# Patient Record
Sex: Female | Born: 1980
Health system: Southern US, Community
[De-identification: ages and names within clinical notes are randomized; demographics above are authoritative.]

## PROBLEM LIST (undated history)

## (undated) DIAGNOSIS — K589 Irritable bowel syndrome without diarrhea: Secondary | ICD-10-CM

## (undated) DIAGNOSIS — T7840XA Allergy, unspecified, initial encounter: Secondary | ICD-10-CM

## (undated) DIAGNOSIS — R008 Other abnormalities of heart beat: Secondary | ICD-10-CM

## (undated) DIAGNOSIS — K219 Gastro-esophageal reflux disease without esophagitis: Secondary | ICD-10-CM

## (undated) DIAGNOSIS — L309 Dermatitis, unspecified: Secondary | ICD-10-CM

## (undated) DIAGNOSIS — E079 Disorder of thyroid, unspecified: Secondary | ICD-10-CM

## (undated) DIAGNOSIS — J302 Other seasonal allergic rhinitis: Secondary | ICD-10-CM

## (undated) DIAGNOSIS — E039 Hypothyroidism, unspecified: Secondary | ICD-10-CM

## (undated) DIAGNOSIS — F419 Anxiety disorder, unspecified: Secondary | ICD-10-CM

## (undated) DIAGNOSIS — I493 Ventricular premature depolarization: Secondary | ICD-10-CM

## (undated) HISTORY — PX: COLONOSCOPY: SHX174

## (undated) HISTORY — DX: Gastro-esophageal reflux disease without esophagitis: K21.9

## (undated) HISTORY — DX: Allergy, unspecified, initial encounter: T78.40XA

## (undated) HISTORY — PX: DIAGNOSTIC LAPAROSCOPY: SUR761

## (undated) HISTORY — DX: Disorder of thyroid, unspecified: E07.9

## (undated) HISTORY — DX: Dermatitis, unspecified: L30.9

## (undated) HISTORY — PX: UPPER GI ENDOSCOPY: SHX6162

## (undated) HISTORY — PX: WISDOM TOOTH EXTRACTION: SHX21

## (undated) HISTORY — DX: Other abnormalities of heart beat: R00.8

---

## 2011-08-16 HISTORY — PX: CHOLECYSTECTOMY: SHX55

## 2014-02-27 DIAGNOSIS — R002 Palpitations: Secondary | ICD-10-CM | POA: Insufficient documentation

## 2014-08-15 HISTORY — PX: OTHER SURGICAL HISTORY: SHX169

## 2015-08-16 HISTORY — PX: OOPHORECTOMY: SHX86

## 2015-08-16 HISTORY — PX: TUBAL LIGATION: SHX77

## 2017-09-11 LAB — HM MAMMOGRAPHY

## 2018-01-13 DIAGNOSIS — F411 Generalized anxiety disorder: Secondary | ICD-10-CM | POA: Diagnosis not present

## 2018-01-23 DIAGNOSIS — N944 Primary dysmenorrhea: Secondary | ICD-10-CM | POA: Diagnosis not present

## 2018-01-23 DIAGNOSIS — N920 Excessive and frequent menstruation with regular cycle: Secondary | ICD-10-CM | POA: Diagnosis not present

## 2018-01-25 DIAGNOSIS — F411 Generalized anxiety disorder: Secondary | ICD-10-CM | POA: Diagnosis not present

## 2018-01-31 MED FILL — NORETHINDRONE 5 MG TABLET: 5 | 30 days supply | Qty: 30 | Fill #0

## 2018-02-14 ENCOUNTER — Encounter: Payer: Self-pay | Admitting: Nurse Practitioner

## 2018-02-14 ENCOUNTER — Ambulatory Visit: Payer: Self-pay | Admitting: Nurse Practitioner

## 2018-02-14 VITALS — BP 130/86 | HR 96 | Temp 98.5°F | Ht 67.0 in | Wt 210.0 lb

## 2018-02-14 DIAGNOSIS — E559 Vitamin D deficiency, unspecified: Secondary | ICD-10-CM | POA: Insufficient documentation

## 2018-02-14 DIAGNOSIS — Z136 Encounter for screening for cardiovascular disorders: Secondary | ICD-10-CM

## 2018-02-14 DIAGNOSIS — N921 Excessive and frequent menstruation with irregular cycle: Secondary | ICD-10-CM | POA: Diagnosis not present

## 2018-02-14 DIAGNOSIS — J309 Allergic rhinitis, unspecified: Secondary | ICD-10-CM

## 2018-02-14 DIAGNOSIS — Z13220 Encounter for screening for lipoid disorders: Secondary | ICD-10-CM

## 2018-02-14 DIAGNOSIS — E039 Hypothyroidism, unspecified: Secondary | ICD-10-CM | POA: Insufficient documentation

## 2018-02-14 DIAGNOSIS — F411 Generalized anxiety disorder: Secondary | ICD-10-CM | POA: Diagnosis not present

## 2018-02-14 DIAGNOSIS — J3089 Other allergic rhinitis: Secondary | ICD-10-CM | POA: Insufficient documentation

## 2018-02-14 DIAGNOSIS — Z0001 Encounter for general adult medical examination with abnormal findings: Secondary | ICD-10-CM | POA: Diagnosis not present

## 2018-02-14 DIAGNOSIS — K219 Gastro-esophageal reflux disease without esophagitis: Secondary | ICD-10-CM | POA: Diagnosis not present

## 2018-02-14 DIAGNOSIS — Z716 Tobacco abuse counseling: Secondary | ICD-10-CM | POA: Diagnosis not present

## 2018-02-14 DIAGNOSIS — M25472 Effusion, left ankle: Principal | ICD-10-CM

## 2018-02-14 DIAGNOSIS — M25471 Effusion, right ankle: Secondary | ICD-10-CM

## 2018-02-14 DIAGNOSIS — Z720 Tobacco use: Secondary | ICD-10-CM | POA: Diagnosis not present

## 2018-02-14 LAB — COMPREHENSIVE METABOLIC PANEL
ALT: 18 U/L (ref 0–35)
AST: 13 U/L (ref 0–37)
Albumin: 4.2 g/dL (ref 3.5–5.2)
Alkaline Phosphatase: 82 U/L (ref 39–117)
BILIRUBIN TOTAL: 0.3 mg/dL (ref 0.2–1.2)
BUN: 10 mg/dL (ref 6–23)
CO2: 27 meq/L (ref 19–32)
Calcium: 9.3 mg/dL (ref 8.4–10.5)
Chloride: 102 mEq/L (ref 96–112)
Creatinine, Ser: 0.73 mg/dL (ref 0.40–1.20)
GFR: 95.45 mL/min (ref >60.00)
GLUCOSE: 93 mg/dL (ref 70–99)
Potassium: 3.7 mEq/L (ref 3.5–5.1)
Sodium: 137 mEq/L (ref 135–145)
TOTAL PROTEIN: 6.9 g/dL (ref 6.0–8.3)

## 2018-02-14 LAB — LIPID PANEL
CHOL/HDL RATIO: 4
Cholesterol: 179 mg/dL (ref 0–200)
HDL: 40.1 mg/dL (ref >39.00)
LDL Cholesterol: 103 mg/dL — ABNORMAL HIGH (ref 0–99)
NONHDL: 138.98
Triglycerides: 181 mg/dL — ABNORMAL HIGH (ref 0.0–149.0)
VLDL: 36.2 mg/dL (ref 0.0–40.0)

## 2018-02-14 LAB — CBC
HCT: 39.3 % (ref 36.0–46.0)
Hemoglobin: 13.4 g/dL (ref 12.0–15.0)
MCHC: 34.1 g/dL (ref 30.0–36.0)
MCV: 88 fl (ref 78.0–100.0)
PLATELETS: 393 10*3/uL (ref 150.0–400.0)
RBC: 4.47 Mil/uL (ref 3.87–5.11)
RDW: 14.2 % (ref 11.5–15.5)
WBC: 7.7 10*3/uL (ref 4.0–10.5)

## 2018-02-14 LAB — IBC PANEL
IRON: 87 ug/dL (ref 42–145)
SATURATION RATIOS: 21.6 % (ref 20.0–50.0)
Transferrin: 288 mg/dL (ref 212.0–360.0)

## 2018-02-14 LAB — T4, FREE: FREE T4: 0.93 ng/dL (ref 0.60–1.60)

## 2018-02-14 LAB — TSH: TSH: 4.29 u[IU]/mL (ref 0.35–4.50)

## 2018-02-14 MED ORDER — FEXOFENADINE HCL 180 MG PO TABS
180.0000 mg | ORAL_TABLET | Freq: Every day | ORAL | 3 refills | Status: DC
Start: 2018-02-14 — End: 2018-10-12

## 2018-02-14 MED ORDER — MONTELUKAST SODIUM 10 MG PO TABS: 10.0000 mg | ORAL_TABLET | Freq: Every day | ORAL | 1 refills | Status: DC

## 2018-02-14 MED ORDER — RANITIDINE HCL 150 MG PO TABS
150.0000 mg | ORAL_TABLET | Freq: Every day | ORAL | 3 refills | Status: DC
Start: 2018-02-14 — End: 2018-10-12

## 2018-02-14 MED ORDER — LEVOTHYROXINE SODIUM 75 MCG PO TABS: 75.0000 ug | ORAL_TABLET | Freq: Every day | ORAL | 1 refills | Status: DC

## 2018-02-14 MED FILL — raNITIdine HCL 150 MG TABS: 150 | 90 days supply | Qty: 90 | Fill #0

## 2018-02-14 NOTE — Progress Notes (Signed)
Subjective:    Patient ID: Katie Bishop, female    DOB: 05-Nov-1980, 37 y.o.   MRN: [id number]  Patient presents today for complete physical and to establish care.  Thyroid Problem  Presents for initial visit. The condition has lasted for 2 years. Patient reports no anxiety, constipation, diarrhea, palpitations or weight loss. The symptoms have been stable. Past treatments include levothyroxine. The treatment provided significant relief. Her past medical history is significant for obesity. There are no known risk factors.  use of current dose for 1year.  LE edema (bilateral): chronic per patient, waxing and waning, improves with use of compression stocking and elevation.denies any pain with ambulation, no redness. Worse with prolong sitting and standing. Improves with elevation and use of compression stocking.  Vitamin D deficiency: Completed 53monthof 50,000IU. Now taking 800IU daily.  Tobacco use: 0.5ppd x 124yr Ready to quit. Quit date 02/26/2018. Tried Wellbutrin in past but did not complete course. She will like to try medication again.  Menorrhagia: Chronic, waxing and waning. Managed by GYN: Dr. MoLaurian BrimHas hysterectomy scheduled for 03/13/2018. Use of ferrous sulfate 2-3times a week due to side effects (constipation).  Allergic Rhinitis: Stable with allegra and singulair.  GERD: Stable with ranitidine  Immunizations: (TDAP, Hep C screen, Pneumovax, Influenza, zoster)  Health Maintenance  Topic Date Due  . Pap Smear  04/30/2002  . Tetanus Vaccine  02/15/2019*  . HIV Screening  02/15/2019*  . Flu Shot  03/15/2018  *Topic was postponed. The date shown is not the original due date.   Diet:regular.  Weight:  Wt Readings from Last 3 Encounters:  02/14/18 210 lb (95.3 kg)   Exercise:walking daily at work  Fall Risk: Fall Risk  02/14/2018  Falls in the past year? Yes  Number falls in past yr: 1  Injury with Fall? No   Home Safety:home with  husband.  Depression/Suicide: Depression screen PHQ 2/9 02/14/2018  Decreased Interest 0  Down, Depressed, Hopeless 0  PHQ - 2 Score 0   Pap Smear (every 3y20yror >21-29 without HPV, every 61yr61yrr >30-661yr661yrh HPV): scheduled hysterectomy at end of this month Current Gyn: Dr. Moss[facility]aurian [facility]im [facility].  Vision: will schedule, use on contact lens.  Dental:will schedule.  Medications and allergies reviewed with patient and updated if appropriate.  There are no active problems to display for this patient.   Current Outpatient Medications on File Prior to Visit  Medication Sig Dispense Refill  . Cholecalciferol (VITAMIN D PO) Take 800 mg by mouth.    . fluconazole (DIFLUCAN) 150 MG tablet 1 TAB BY MOUTH NOW MAY REPEAT IN ONE WEEK  0   No current facility-administered medications on file prior to visit.     Past Medical History:  Diagnosis Date  . Allergy   . GERD (gastroesophageal reflux disease)   . Other abnormalities of heart beat    heart arrhythmai PACs PVCs  . Thyroid disease     Social History   Socioeconomic History  . Marital status: Married    Spouse name: Not on file  . Number of children: Not on file  . Years of education: Not on file  . Highest education level: Not on file  Occupational History  . Not on file  Social Needs  . Financial resource strain: Not on file  . Food insecurity:    Worry: Not on file    Inability: Not on file  . Transportation needs:    Medical: Not on file  Non-medical: Not on file  Tobacco Use  . Smoking status: Current Every Day Smoker    Packs/day: 0.50    Types: Cigarettes  . Smokeless tobacco: Never Used  Substance and Sexual Activity  . Alcohol use: Yes    Comment: social   . Drug use: Never  . Sexual activity: Not on file  Lifestyle  . Physical activity:    Days per week: Not on file    Minutes per session: Not on file  . Stress: Not on file  Relationships  . Social connections:    Talks on phone: Not on  file    Gets together: Not on file    Attends religious service: Not on file    Active member of club or organization: Not on file    Attends meetings of clubs or organizations: Not on file    Relationship status: Not on file  Other Topics Concern  . Not on file  Social History Narrative  . Not on file    Family History  Problem Relation Age of Onset  . Hyperlipidemia Mother   . Hypertension Mother   . Mitral valve prolapse Mother   . Hypothyroidism Mother   . Hyperlipidemia Father   . Multiple myeloma Maternal Grandmother   . Anuerysm Paternal Grandmother        brain  . Cancer Paternal Grandfather        lung cancer  . Cancer Maternal Aunt        breast cancer  . Cancer Maternal Aunt        breast cancer  . Cancer Maternal Aunt        breast cancer       Review of Systems  Constitutional: Negative for fever, malaise/fatigue and weight loss.  HENT: Negative for congestion and sore throat.   Eyes:       Negative for visual changes  Respiratory: Negative for cough and shortness of breath.   Cardiovascular: Negative for chest pain, palpitations and leg swelling.  Gastrointestinal: Negative for blood in stool, constipation, diarrhea and heartburn.  Genitourinary: Negative for dysuria, frequency and urgency.  Musculoskeletal: Negative for falls, joint pain and myalgias.  Skin: Negative for rash.  Neurological: Negative for dizziness, sensory change and headaches.  Endo/Heme/Allergies: Does not bruise/bleed easily.  Psychiatric/Behavioral: Negative for depression, substance abuse and suicidal ideas. The patient is not nervous/anxious and does not have insomnia.     Objective:   Vitals:   02/14/18 0818  BP: 130/86  Pulse: 96  Temp: 98.5 F (36.9 C)  SpO2: 99%    Body mass index is 32.89 kg/m.   Physical Examination:  Physical Exam  Constitutional: She is oriented to person, place, and time. She appears well-developed. No distress.  HENT:  Right Ear:  External ear normal.  Left Ear: External ear normal.  Nose: Nose normal.  Mouth/Throat: Oropharynx is clear and moist. No oropharyngeal exudate.  Eyes: Pupils are equal, round, and reactive to light. Conjunctivae and EOM are normal.  Neck: Normal range of motion. Neck supple. No thyromegaly present.  Cardiovascular: Normal rate, regular rhythm and normal heart sounds.  Pulmonary/Chest: Effort normal and breath sounds normal. No respiratory distress. She exhibits no tenderness.  Abdominal: Soft. Bowel sounds are normal. There is no tenderness.  Genitourinary:  Genitourinary Comments: Deferred breast and pelvic exam to GYN  Musculoskeletal: Normal range of motion. She exhibits no edema.  Lymphadenopathy:    She has no cervical adenopathy.  Neurological: She is alert and  oriented to person, place, and time. She has normal reflexes.  Skin: Skin is warm and dry. No rash noted.  Psychiatric: She has a normal mood and affect. Her behavior is normal. Thought content normal.  Vitals reviewed.   ASSESSMENT and PLAN:  Annalee was seen today for establish care and medication refill.  Diagnoses and all orders for this visit:  Encounter for preventative adult health care exam with abnormal findings -     Comprehensive metabolic panel  Menorrhagia with irregular cycle -     CBC -     IBC panel  Vitamin D deficiency -     Vitamin D 1,25 dihydroxy  Hypothyroidism, unspecified type -     TSH -     T4, free -     levothyroxine (SYNTHROID, LEVOTHROID) 75 MCG tablet; Take 1 tablet (75 mcg total) by mouth daily before breakfast.  Encounter for smoking cessation counseling  Tobacco use  Encounter for lipid screening for cardiovascular disease -     Lipid panel  Allergic rhinitis, unspecified seasonality, unspecified trigger -     fexofenadine (ALLEGRA ALLERGY) 180 MG tablet; Take 1 tablet (180 mg total) by mouth daily. -     montelukast (SINGULAIR) 10 MG tablet; Take 1 tablet (10 mg total)  by mouth daily.  Gastroesophageal reflux disease, esophagitis presence not specified -     ranitidine (ZANTAC) 150 MG tablet; Take 1 tablet (150 mg total) by mouth at bedtime.   No problem-specific Assessment & Plan notes found for this encounter.     Follow up: Return in about 1 month (around 03/14/2018) for tobacco cessation.  Wilfred Lacy, NP

## 2018-02-14 NOTE — Patient Instructions (Addendum)
You have picked 02/26/2018 as your quit date. Start zyban 1week prior to quit date. Will send prescription after review of lab results.  Use compression stocking and maintain DASH diet to help with LE edema.  Normal CMP, iron panel, and CBC. Stable thyroid function. Vitamin D level pending Montelukast and levothyroxine refill sent. Let me know if you change your mind about zyban prescription (see mychart message)  Health Maintenance, Female Adopting a healthy lifestyle and getting preventive care can go a long way to promote health and wellness. Talk with your health care provider about what schedule of regular examinations is right for you. This is a good chance for you to check in with your provider about disease prevention and staying healthy. In between checkups, there are plenty of things you can do on your own. Experts have done a lot of research about which lifestyle changes and preventive measures are most likely to keep you healthy. Ask your health care provider for more information. Weight and diet Eat a healthy diet  Be sure to include plenty of vegetables, fruits, low-fat dairy products, and lean protein.  Do not eat a lot of foods high in solid fats, added sugars, or salt.  Get regular exercise. This is one of the most important things you can do for your health. ? Most adults should exercise for at least 150 minutes each week. The exercise should increase your heart rate and make you sweat (moderate-intensity exercise). ? Most adults should also do strengthening exercises at least twice a week. This is in addition to the moderate-intensity exercise.  Maintain a healthy weight  Body mass index (BMI) is a measurement that can be used to identify possible weight problems. It estimates body fat based on height and weight. Your health care provider can help determine your BMI and help you achieve or maintain a healthy weight.  For females 87 years of age and older: ? A BMI  below 18.5 is considered underweight. ? A BMI of 18.5 to 24.9 is normal. ? A BMI of 25 to 29.9 is considered overweight. ? A BMI of 30 and above is considered obese.  Watch levels of cholesterol and blood lipids  You should start having your blood tested for lipids and cholesterol at 37 years of age, then have this test every 5 years.  You may need to have your cholesterol levels checked more often if: ? Your lipid or cholesterol levels are high. ? You are older than 37 years of age. ? You are at high risk for heart disease.  Cancer screening Lung Cancer  Lung cancer screening is recommended for adults 79-24 years old who are at high risk for lung cancer because of a history of smoking.  A yearly low-dose CT scan of the lungs is recommended for people who: ? Currently smoke. ? Have quit within the past 15 years. ? Have at least a 30-pack-year history of smoking. A pack year is smoking an average of one pack of cigarettes a day for 1 year.  Yearly screening should continue until it has been 15 years since you quit.  Yearly screening should stop if you develop a health problem that would prevent you from having lung cancer treatment.  Breast Cancer  Practice breast self-awareness. This means understanding how your breasts normally appear and feel.  It also means doing regular breast self-exams. Let your health care provider know about any changes, no matter how small.  If you are in your 20s or 30s,  you should have a clinical breast exam (CBE) by a health care provider every 1-3 years as part of a regular health exam.  If you are 51 or older, have a CBE every year. Also consider having a breast X-ray (mammogram) every year.  If you have a family history of breast cancer, talk to your health care provider about genetic screening.  If you are at high risk for breast cancer, talk to your health care provider about having an MRI and a mammogram every year.  Breast cancer gene  (BRCA) assessment is recommended for women who have family members with BRCA-related cancers. BRCA-related cancers include: ? Breast. ? Ovarian. ? Tubal. ? Peritoneal cancers.  Results of the assessment will determine the need for genetic counseling and BRCA1 and BRCA2 testing.  Cervical Cancer Your health care provider may recommend that you be screened regularly for cancer of the pelvic organs (ovaries, uterus, and vagina). This screening involves a pelvic examination, including checking for microscopic changes to the surface of your cervix (Pap test). You may be encouraged to have this screening done every 3 years, beginning at age 25.  For women ages 100-65, health care providers may recommend pelvic exams and Pap testing every 3 years, or they may recommend the Pap and pelvic exam, combined with testing for human papilloma virus (HPV), every 5 years. Some types of HPV increase your risk of cervical cancer. Testing for HPV may also be done on women of any age with unclear Pap test results.  Other health care providers may not recommend any screening for nonpregnant women who are considered low risk for pelvic cancer and who do not have symptoms. Ask your health care provider if a screening pelvic exam is right for you.  If you have had past treatment for cervical cancer or a condition that could lead to cancer, you need Pap tests and screening for cancer for at least 20 years after your treatment. If Pap tests have been discontinued, your risk factors (such as having a new sexual partner) need to be reassessed to determine if screening should resume. Some women have medical problems that increase the chance of getting cervical cancer. In these cases, your health care provider may recommend more frequent screening and Pap tests.  Colorectal Cancer  This type of cancer can be detected and often prevented.  Routine colorectal cancer screening usually begins at 37 years of age and continues  through 37 years of age.  Your health care provider may recommend screening at an earlier age if you have risk factors for colon cancer.  Your health care provider may also recommend using home test kits to check for hidden blood in the stool.  A small camera at the end of a tube can be used to examine your colon directly (sigmoidoscopy or colonoscopy). This is done to check for the earliest forms of colorectal cancer.  Routine screening usually begins at age 74.  Direct examination of the colon should be repeated every 5-10 years through 36 years of age. However, you may need to be screened more often if early forms of precancerous polyps or small growths are found.  Skin Cancer  Check your skin from head to toe regularly.  Tell your health care provider about any new moles or changes in moles, especially if there is a change in a mole's shape or color.  Also tell your health care provider if you have a mole that is larger than the size of a pencil eraser.  Always use sunscreen. Apply sunscreen liberally and repeatedly throughout the day.  Protect yourself by wearing long sleeves, pants, a wide-brimmed hat, and sunglasses whenever you are outside.  Heart disease, diabetes, and high blood pressure  High blood pressure causes heart disease and increases the risk of stroke. High blood pressure is more likely to develop in: ? People who have blood pressure in the high end of the normal range (130-139/85-89 mm Hg). ? People who are overweight or obese. ? People who are African American.  If you are 74-104 years of age, have your blood pressure checked every 3-5 years. If you are 53 years of age or older, have your blood pressure checked every year. You should have your blood pressure measured twice-once when you are at a hospital or clinic, and once when you are not at a hospital or clinic. Record the average of the two measurements. To check your blood pressure when you are not at a  hospital or clinic, you can use: ? An automated blood pressure machine at a pharmacy. ? A home blood pressure monitor.  If you are between 90 years and 41 years old, ask your health care provider if you should take aspirin to prevent strokes.  Have regular diabetes screenings. This involves taking a blood sample to check your fasting blood sugar level. ? If you are at a normal weight and have a low risk for diabetes, have this test once every three years after 37 years of age. ? If you are overweight and have a high risk for diabetes, consider being tested at a younger age or more often. Preventing infection Hepatitis B  If you have a higher risk for hepatitis B, you should be screened for this virus. You are considered at high risk for hepatitis B if: ? You were born in a country where hepatitis B is common. Ask your health care provider which countries are considered high risk. ? Your parents were born in a high-risk country, and you have not been immunized against hepatitis B (hepatitis B vaccine). ? You have HIV or AIDS. ? You use needles to inject street drugs. ? You live with someone who has hepatitis B. ? You have had sex with someone who has hepatitis B. ? You get hemodialysis treatment. ? You take certain medicines for conditions, including cancer, organ transplantation, and autoimmune conditions.  Hepatitis C  Blood testing is recommended for: ? Everyone born from [birth year] through 1965. ? Anyone with known risk factors for hepatitis C.  Sexually transmitted infections (STIs)  You should be screened for sexually transmitted infections (STIs) including gonorrhea and chlamydia if: ? You are sexually active and are younger than 37 years of age. ? You are older than 37 years of age and your health care provider tells you that you are at risk for this type of infection. ? Your sexual activity has changed since you were last screened and you are at an increased risk for chlamydia or  gonorrhea. Ask your health care provider if you are at risk.  If you do not have HIV, but are at risk, it may be recommended that you take a prescription medicine daily to prevent HIV infection. This is called pre-exposure prophylaxis (PrEP). You are considered at risk if: ? You are sexually active and do not regularly use condoms or know the HIV status of your partner(s). ? You take drugs by injection. ? You are sexually active with a partner who has HIV.  Talk with  your health care provider about whether you are at high risk of being infected with HIV. If you choose to begin PrEP, you should first be tested for HIV. You should then be tested every 3 months for as long as you are taking PrEP. Pregnancy  If you are premenopausal and you may become pregnant, ask your health care provider about preconception counseling.  If you may become pregnant, take 400 to 800 micrograms (mcg) of folic acid every day.  If you want to prevent pregnancy, talk to your health care provider about birth control (contraception). Osteoporosis and menopause  Osteoporosis is a disease in which the bones lose minerals and strength with aging. This can result in serious bone fractures. Your risk for osteoporosis can be identified using a bone density scan.  If you are 47 years of age or older, or if you are at risk for osteoporosis and fractures, ask your health care provider if you should be screened.  Ask your health care provider whether you should take a calcium or vitamin D supplement to lower your risk for osteoporosis.  Menopause may have certain physical symptoms and risks.  Hormone replacement therapy may reduce some of these symptoms and risks. Talk to your health care provider about whether hormone replacement therapy is right for you. Follow these instructions at home:  Schedule regular health, dental, and eye exams.  Stay current with your immunizations.  Do not use any tobacco products including  cigarettes, chewing tobacco, or electronic cigarettes.  If you are pregnant, do not drink alcohol.  If you are breastfeeding, limit how much and how often you drink alcohol.  Limit alcohol intake to no more than 1 drink per day for nonpregnant women. One drink equals 12 ounces of beer, 5 ounces of wine, or 1 ounces of hard liquor.  Do not use street drugs.  Do not share needles.  Ask your health care provider for help if you need support or information about quitting drugs.  Tell your health care provider if you often feel depressed.  Tell your health care provider if you have ever been abused or do not feel safe at home. This information is not intended to replace advice given to you by your health care provider. Make sure you discuss any questions you have with your health care provider. Document Released: 02/14/2011 Document Revised: 01/07/2016 Document Reviewed: 05/05/2015 Elsevier Interactive Patient Education  [employer].

## 2018-02-16 LAB — VITAMIN D 1,25 DIHYDROXY
Vitamin D 1, 25 (OH)2 Total: 65 pg/mL (ref 18–72)
Vitamin D2 1, 25 (OH)2: <8 pg/mL
Vitamin D3 1, 25 (OH)2: 65 pg/mL

## 2018-02-16 MED ORDER — FUROSEMIDE 20 MG PO TABS: 10.0000 mg | ORAL_TABLET | Freq: Every day | ORAL | 0 refills | Status: DC | PRN

## 2018-02-16 MED FILL — FUROSEMIDE 20 MG TABS: 20 | 10 days supply | Qty: 5 | Fill #0

## 2018-02-16 MED FILL — MONTELUKAST SOD 10 MG TAB: 10 | 90 days supply | Qty: 90 | Fill #0

## 2018-02-16 MED FILL — LEVOTHYROXINE 75 MCG TABLET: 75 | 90 days supply | Qty: 90 | Fill #0

## 2018-02-21 NOTE — Patient Instructions (Addendum)
Your procedure is scheduled on:  Wednesday, July 31  [facility] am  Pick up the phone at the desk and dial [phone].  Call this number if you have problems the morning of surgery: [phone].  Remember: Do NOT eat food or Do NOT drink clear liquids (including water) after midnight Tuesday.  Take these medicines the morning of surgery with a SIP OF WATER:  None  Bring albuterol inhaler with you on day of surgery.  Brush your teeth on the morning of surgery.  Do Not smoke on the day of surgery.  Stop herbal medications, vitamin supplements, Ibuprofen/NSAIDS 1 week prior to surgery.  Do NOT wear jewelry (body piercing), metal hair clips/bobby pins, make-up, or nail polish. Do NOT wear lotions, powders, or perfumes.  You may wear deoderant. Do NOT shave for 48 hours prior to surgery. Do NOT bring valuables to the hospital. Contacts may not be worn into surgery.  Leave suitcase in car.  After surgery it may be brought to your room.  For patients admitted to the hospital, checkout time is 11:00 AM the day of discharge. Home with Husband Viviann SpareSteven cell [phone] or Mother Misty StanleyLisa cell [phone].

## 2018-03-05 ENCOUNTER — Other Ambulatory Visit: Payer: Self-pay

## 2018-03-05 ENCOUNTER — Encounter (HOSPITAL_COMMUNITY)
Admission: RE | Admit: 2018-03-05 | Discharge: 2018-03-05 | Disposition: A | Discharge status: Home / Self Care | Payer: 59 | Source: Ambulatory Visit | Attending: Obstetrics and Gynecology | Admitting: Obstetrics and Gynecology

## 2018-03-05 ENCOUNTER — Encounter (HOSPITAL_COMMUNITY): Payer: Self-pay | Admitting: *Deleted

## 2018-03-05 DIAGNOSIS — Z01812 Encounter for preprocedural laboratory examination: Secondary | ICD-10-CM | POA: Diagnosis not present

## 2018-03-05 HISTORY — DX: Ventricular premature depolarization: I49.3

## 2018-03-05 HISTORY — DX: Irritable bowel syndrome without diarrhea: K58.9

## 2018-03-05 HISTORY — DX: Anxiety disorder, unspecified: F41.9

## 2018-03-05 HISTORY — DX: Other seasonal allergic rhinitis: J30.2

## 2018-03-05 HISTORY — DX: Hypothyroidism, unspecified: E03.9

## 2018-03-05 LAB — CBC
HCT: 39.6 % (ref 36.0–46.0)
HEMOGLOBIN: 13.3 g/dL (ref 12.0–15.0)
MCH: 30.4 pg (ref 26.0–34.0)
MCHC: 33.6 g/dL (ref 30.0–36.0)
MCV: 90.6 fL (ref 78.0–100.0)
PLATELETS: 359 10*3/uL (ref 150–400)
RBC: 4.37 MIL/uL (ref 3.87–5.11)
RDW: 14.2 % (ref 11.5–15.5)
WBC: 6.9 10*3/uL (ref 4.0–10.5)

## 2018-03-06 ENCOUNTER — Encounter: Payer: Self-pay | Admitting: Nurse Practitioner

## 2018-03-06 ENCOUNTER — Ambulatory Visit: Payer: 59 | Admitting: Nurse Practitioner

## 2018-03-06 VITALS — BP 120/84 | HR 80 | Temp 98.0°F | Ht 65.0 in | Wt 209.0 lb

## 2018-03-06 DIAGNOSIS — R21 Rash and other nonspecific skin eruption: Secondary | ICD-10-CM | POA: Diagnosis not present

## 2018-03-06 DIAGNOSIS — J0140 Acute pansinusitis, unspecified: Secondary | ICD-10-CM

## 2018-03-06 MED ORDER — SACCHAROMYCES BOULARDII 250 MG PO CAPS: 250.0000 mg | ORAL_CAPSULE | Freq: Two times a day (BID) | ORAL | Status: DC

## 2018-03-06 MED ORDER — CLARITHROMYCIN 500 MG PO TABS: 500.0000 mg | ORAL_TABLET | Freq: Two times a day (BID) | ORAL | 0 refills | Status: DC

## 2018-03-06 MED ORDER — SALINE SPRAY 0.65 % NA SOLN: 1.0000 | NASAL | 0 refills | Status: DC | PRN

## 2018-03-06 MED ORDER — PREDNISONE 20 MG PO TABS: 20.0000 mg | ORAL_TABLET | Freq: Every day | ORAL | 0 refills | Status: DC

## 2018-03-06 MED FILL — predniSONE 20 MG TABS: 20 | 6 days supply | Qty: 6 | Fill #0

## 2018-03-06 MED FILL — CLARITHROMYCIN 500 MG TAB: 500 | 7 days supply | Qty: 14 | Fill #0

## 2018-03-06 NOTE — Progress Notes (Signed)
Subjective:  Patient ID: Katie Bishop, female    DOB: [birth year]  Age: 37 y.o. MRN: [id number]  CC: Nasal Congestion (chest congestion,coughing yellow mucus at times, sinius pressure. finish abx, not better. )   Sinusitis  This is a new problem. The current episode started 1 to 4 weeks ago. The problem is unchanged. There has been no fever. Associated symptoms include congestion, coughing, ear pain, headaches, sinus pressure, sneezing, a sore throat and swollen glands. Pertinent negatives include no chills, diaphoresis or neck pain. Past treatments include oral decongestants, antibiotics, saline sprays and spray decongestants. The treatment provided mild relief.  Rash  This is a recurrent problem. The current episode started more than 1 month ago. The problem has been waxing and waning since onset. The affected locations include the chest. The rash is characterized by itchiness, dryness and scaling. It is unknown if there was an exposure to a precipitant. Associated symptoms include congestion, coughing and a sore throat. Pertinent negatives include no joint pain. Past treatments include antihistamine and topical steroids. The treatment provided mild relief. Her past medical history is significant for allergies and eczema.  augmentin x 7days Allegra D x 3days, Sinus rinse, afrin.  Does not remember exact allergic reaction to fluocinolone. Denies any adverse reaction with oral prednisone and hydrocortisone cream.  Reviewed past Medical, Social and Family history today.  Outpatient Medications Prior to Visit  Medication Sig Dispense Refill  . albuterol (PROVENTIL HFA;VENTOLIN HFA) 108 (90 Base) MCG/ACT inhaler Inhale 1-2 puffs into the lungs every 6 (six) hours as needed for wheezing or shortness of breath.    . Calcium Carb-Cholecalciferol (CALCIUM 600+D3) 600-800 MG-UNIT TABS Take 1 tablet by mouth daily.    . calcium carbonate (TUMS - DOSED IN MG ELEMENTAL CALCIUM) 500 MG chewable tablet  Chew 2 tablets by mouth daily as needed for indigestion or heartburn.    . fexofenadine (ALLEGRA ALLERGY) 180 MG tablet Take 1 tablet (180 mg total) by mouth daily. (Patient taking differently: Take 180 mg by mouth every evening. ) 90 tablet 3  . fexofenadine-pseudoephedrine (ALLEGRA-D 24) 180-240 MG 24 hr tablet Take 1 tablet by mouth at bedtime.    . fluticasone (FLONASE) 50 MCG/ACT nasal spray Place 2 sprays into both nostrils at bedtime.    Marland Kitchen ibuprofen (ADVIL,MOTRIN) 200 MG tablet Take 800 mg by mouth 2 (two) times daily as needed for headache or moderate pain.    Marland Kitchen levothyroxine (SYNTHROID, LEVOTHROID) 75 MCG tablet Take 1 tablet (75 mcg total) by mouth daily before breakfast. (Patient taking differently: Take 75 mcg by mouth every evening. ) 90 tablet 1  . LORazepam (ATIVAN) 0.5 MG tablet Take 0.25 mg by mouth daily as needed for anxiety.    . montelukast (SINGULAIR) 10 MG tablet Take 1 tablet (10 mg total) by mouth daily. (Patient taking differently: Take 10 mg by mouth at bedtime. ) 90 tablet 1  . oxymetazoline (AFRIN) 0.05 % nasal spray Place 2 sprays into both nostrils daily as needed for congestion.    . ranitidine (ZANTAC) 150 MG tablet Take 1 tablet (150 mg total) by mouth at bedtime. 90 tablet 3  . amoxicillin-clavulanate (AUGMENTIN) 875-125 MG tablet Take 1 tablet by mouth 2 (two) times daily.  0  . furosemide (LASIX) 20 MG tablet Take 0.5 tablets (10 mg total) by mouth daily as needed for edema. (Patient not taking: Reported on 03/06/2018) 5 tablet 0   No facility-administered medications prior to visit.     ROS See HPI  Objective:  BP 120/84   Pulse 80   Temp 98 F (36.7 C) (Oral)   Ht 5\' 5"  (1.651 m)   Wt 209 lb (94.8 kg)   LMP 02/23/2018 (Approximate)   SpO2 99%   BMI 34.78 kg/m   BP Readings from Last 3 Encounters:  03/06/18 120/84  02/14/18 130/86    Wt Readings from Last 3 Encounters:  03/06/18 209 lb (94.8 kg)  02/14/18 210 lb (95.3 kg)    Physical Exam    Constitutional: She is oriented to person, place, and time. No distress.  HENT:  Right Ear: Tympanic membrane, external ear and ear canal normal.  Left Ear: Tympanic membrane, external ear and ear canal normal.  Nose: Mucosal edema and rhinorrhea present. Right sinus exhibits maxillary sinus tenderness and frontal sinus tenderness. Left sinus exhibits maxillary sinus tenderness and frontal sinus tenderness.  Mouth/Throat: Uvula is midline. No trismus in the jaw. Posterior oropharyngeal erythema present. No oropharyngeal exudate or posterior oropharyngeal edema.  Eyes: No scleral icterus.  Neck: Normal range of motion. Neck supple.  Cardiovascular: Normal rate and normal heart sounds.  Pulmonary/Chest: Effort normal and breath sounds normal.  Musculoskeletal: She exhibits no edema.  Lymphadenopathy:    She has cervical adenopathy.  Neurological: She is alert and oriented to person, place, and time.  Skin: Rash noted. Rash is macular.     Vitals reviewed.   Lab Results  Component Value Date   WBC 6.9 03/05/2018   HGB 13.3 03/05/2018   HCT 39.6 03/05/2018   PLT 359 03/05/2018   GLUCOSE 93 02/14/2018   CHOL 179 02/14/2018   TRIG 181.0 (H) 02/14/2018   HDL 40.10 02/14/2018   LDLCALC 103 (H) 02/14/2018   ALT 18 02/14/2018   AST 13 02/14/2018   NA 137 02/14/2018   K 3.7 02/14/2018   CL 102 02/14/2018   CREATININE 0.73 02/14/2018   BUN 10 02/14/2018   CO2 27 02/14/2018   TSH 4.29 02/14/2018    No results found.  Assessment & Plan:   Klarisa was seen today for nasal congestion.  Diagnoses and all orders for this visit:  Acute pansinusitis, recurrence not specified -     predniSONE (DELTASONE) 20 MG tablet; Take 1 tablet (20 mg total) by mouth daily with breakfast. -     clarithromycin (BIAXIN) 500 MG tablet; Take 1 tablet (500 mg total) by mouth 2 (two) times daily. -     sodium chloride (OCEAN) 0.65 % SOLN nasal spray; Place 1 spray into both nostrils as needed for  congestion. -     saccharomyces boulardii (FLORASTOR) 250 MG capsule; Take 1 capsule (250 mg total) by mouth 2 (two) times daily.  Rash   I am having Katie Bishop start on predniSONE, clarithromycin, sodium chloride, and saccharomyces boulardii. I am also having her maintain her fexofenadine, ranitidine, levothyroxine, montelukast, furosemide, amoxicillin-clavulanate, fluticasone, oxymetazoline, LORazepam, ibuprofen, Calcium Carb-Cholecalciferol, calcium carbonate, fexofenadine-pseudoephedrine, and albuterol.  Meds ordered this encounter  Medications  . predniSONE (DELTASONE) 20 MG tablet    Sig: Take 1 tablet (20 mg total) by mouth daily with breakfast.    Dispense:  6 tablet    Refill:  0    Order Specific Question:   Supervising Provider    Answer:   Dianne Dun [[id number]]  . clarithromycin (BIAXIN) 500 MG tablet    Sig: Take 1 tablet (500 mg total) by mouth 2 (two) times daily.    Dispense:  14 tablet    Refill:  0    Order Specific Question:   Supervising Provider    Answer:   Dianne DunARON, TALIA M [[id number]]  . sodium chloride (OCEAN) 0.65 % SOLN nasal spray    Sig: Place 1 spray into both nostrils as needed for congestion.    Dispense:  15 mL    Refill:  0    Order Specific Question:   Supervising Provider    Answer:   Dianne DunARON, TALIA M [[id number]]  . saccharomyces boulardii (FLORASTOR) 250 MG capsule    Sig: Take 1 capsule (250 mg total) by mouth 2 (two) times daily.    Order Specific Question:   Supervising Provider    Answer:   Dianne DunARON, TALIA M [[id number]]    Follow-up: No follow-ups on file.  Alysia Pennaharlotte Faizan Geraci, NP

## 2018-03-06 NOTE — Patient Instructions (Signed)
Let me know if rash dose not improve after completion of oral prednisone.  Continue cerave lotion.  Will need sinus imaging if no improving after completion of clarithromycin.

## 2018-03-08 DIAGNOSIS — F4324 Adjustment disorder with disturbance of conduct: Secondary | ICD-10-CM | POA: Diagnosis not present

## 2018-03-12 DIAGNOSIS — F4323 Adjustment disorder with mixed anxiety and depressed mood: Secondary | ICD-10-CM | POA: Diagnosis not present

## 2018-03-14 ENCOUNTER — Ambulatory Visit (HOSPITAL_COMMUNITY): Admission: RE | Admit: 2018-03-14 | Payer: 59 | Source: Ambulatory Visit | Admitting: Obstetrics and Gynecology

## 2018-03-14 SURGERY — HYSTERECTOMY, VAGINAL, LAPAROSCOPY-ASSISTED
Anesthesia: General

## 2018-04-05 DIAGNOSIS — F4323 Adjustment disorder with mixed anxiety and depressed mood: Secondary | ICD-10-CM | POA: Diagnosis not present

## 2018-04-11 ENCOUNTER — Ambulatory Visit: Payer: Self-pay | Admitting: Nurse Practitioner

## 2018-04-15 ENCOUNTER — Encounter: Payer: Self-pay | Admitting: Nurse Practitioner

## 2018-04-15 ENCOUNTER — Ambulatory Visit (INDEPENDENT_AMBULATORY_CARE_PROVIDER_SITE_OTHER): Payer: Self-pay | Admitting: Nurse Practitioner

## 2018-04-15 VITALS — BP 138/82 | HR 94 | Temp 99.5°F | Resp 18 | Ht 66.0 in | Wt 207.8 lb

## 2018-04-15 DIAGNOSIS — N390 Urinary tract infection, site not specified: Secondary | ICD-10-CM

## 2018-04-15 DIAGNOSIS — R350 Frequency of micturition: Secondary | ICD-10-CM

## 2018-04-15 LAB — POCT URINALYSIS DIP (MANUAL ENTRY)
BILIRUBIN UA: NEGATIVE
BILIRUBIN UA: NEGATIVE mg/dL
Blood, UA: NEGATIVE
Glucose, UA: NEGATIVE mg/dL
Nitrite, UA: NEGATIVE
PH UA: 8.5 — AB (ref 5.0–8.0)
PROTEIN UA: NEGATIVE mg/dL
Spec Grav, UA: 1.01 (ref 1.010–1.025)
Urobilinogen, UA: 0.2 E.U./dL

## 2018-04-15 MED ORDER — NITROFURANTOIN MONOHYD MACRO 100 MG PO CAPS: 100.0000 mg | ORAL_CAPSULE | Freq: Two times a day (BID) | ORAL | 0 refills | Status: AC

## 2018-04-15 NOTE — Progress Notes (Signed)
Subjective:    Katie Bishop is a 37 y.o. female who complains of left flank pain, frequency, nausea, nocturia, suprapubic pressure, urgency and fever for 6 days.  Patient also complains of back pain and stomach ache. Patient denies congestion, cough, rhinitis and sorethroat.  Patient does not have a history of recurrent UTI.  Patient does not have a history of pyelonephritis.  The following portions of the patient's history were reviewed and updated as appropriate: allergies, current medications and past medical history. Review of Systems Constitutional: positive for fevers, negative for anorexia, chills, malaise and sweats Eyes: negative Ears, nose, mouth, throat, and face: negative Respiratory: negative Cardiovascular: negative Gastrointestinal: positive for abdominal pain and nausea, negative for change in bowel habits, constipation, diarrhea and vomiting Genitourinary:positive for frequency, hesitancy and nocturia, negative for vaginal discharge, decreased stream, dysuria, hematuria and urinary incontinence    Objective:    BP 138/82 (BP Location: Right Arm, Patient Position: Sitting, Cuff Size: Normal)   Pulse 94   Temp 99.5 F (37.5 C) (Oral)   Resp 18   Ht 5\' 6"  (1.676 m)   Wt 207 lb 12.8 oz (94.3 kg)   LMP 03/31/2018 (Approximate)   SpO2 97%   BMI 33.54 kg/m  General: alert, cooperative and no distress  Abdomen: tenderness mild in the LLQ and suprapubic area in the entire abdomen  Back: back muscles are full ROM, left CVA tenderness  GU: defer exam   Laboratory:  Urine dipstick shows sp gravity 1.010, negative for glucose, negative for hemoglobin, negative for ketones, 2+ for leukocyte esterase, negative for nitrites, negative for protein and 0.2 for urobilinogen.   Micro exam: not done.    Assessment:    Acute cystitis    Plan:   Exam findings, diagnosis etiology and medication use and indications reviewed with patient. Follow- Up and discharge instructions  provided. No emergent/urgent issues found on exam.  Education was provided. Patient verbalized understanding of information provided and agrees with plan of care (POC), all questions answered. The patient is advised to call or return to clinic if he does not see an improvement in symptoms, or to seek the care of the closest emergency department if condtion worsens with the above plan.   1. Urinary frequency  - POCT urinalysis dipstick - nitrofurantoin, macrocrystal-monohydrate, (MACROBID) 100 MG capsule; Take 1 capsule (100 mg total) by mouth 2 (two) times daily for 5 days.  Dispense: 10 capsule; Refill: 0 -Take medications as directed. -Avoid caffeine's, to include teas, sodas, and coffee. -Create a toileting schedule that allows toileting every 2 hours. -Increase fluids. -Ibuprofen or Tylenol for pain, fever, or general discomfort. -Void approximately 15 to 20 minutes after sexual intercourse. -Follow-up in the ER if you have worsening fever, worsening abdominal pain, weakness, or other concerns. -Follow-up with PCP if symptoms do not improve.   2. Acute UTI  - nitrofurantoin, macrocrystal-monohydrate, (MACROBID) 100 MG capsule; Take 1 capsule (100 mg total) by mouth 2 (two) times daily for 5 days.  Dispense: 10 capsule; Refill: 0 -Take medications as directed. -Avoid caffeine's, to include teas, sodas, and coffee. -Create a toileting schedule that allows toileting every 2 hours. -Increase fluids. -Ibuprofen or Tylenol for pain, fever, or general discomfort. -Void approximately 15 to 20 minutes after sexual intercourse. -Follow-up in the ER if you have worsening fever, worsening abdominal pain, weakness, or other concerns. -Follow-up with PCP if symptoms do not improve.

## 2018-04-15 NOTE — Patient Instructions (Signed)
Urinary Tract Infection, Adult -Take medications as directed. -Avoid caffeine's, to include teas, sodas, and coffee. -Create a toileting schedule that allows toileting every 2 hours. -Increase fluids. -Ibuprofen or Tylenol for pain, fever, or general discomfort. -Void approximately 15 to 20 minutes after sexual intercourse. -Follow-up in the ER if you have worsening fever, worsening abdominal pain, weakness, or other concerns. -Follow-up with PCP if symptoms do not improve.   A urinary tract infection (UTI) is an infection of any part of the urinary tract, which includes the kidneys, ureters, bladder, and urethra. These organs make, store, and get rid of urine in the body. UTI can be a bladder infection (cystitis) or kidney infection (pyelonephritis). What are the causes? This infection may be caused by fungi, viruses, or bacteria. Bacteria are the most common cause of UTIs. This condition can also be caused by repeated incomplete emptying of the bladder during urination. What increases the risk? This condition is more likely to develop if:  You ignore your need to urinate or hold urine for long periods of time.  You do not empty your bladder completely during urination.  You wipe back to front after urinating or having a bowel movement, if you are female.  You are uncircumcised, if you are female.  You are constipated.  You have a urinary catheter that stays in place (indwelling).  You have a weak defense (immune) system.  You have a medical condition that affects your bowels, kidneys, or bladder.  You have diabetes.  You take antibiotic medicines frequently or for long periods of time, and the antibiotics no longer work well against certain types of infections (antibiotic resistance).  You take medicines that irritate your urinary tract.  You are exposed to chemicals that irritate your urinary tract.  You are female.  What are the signs or symptoms? Symptoms of this  condition include:  Fever.  Frequent urination or passing small amounts of urine frequently.  Needing to urinate urgently.  Pain or burning with urination.  Urine that smells bad or unusual.  Cloudy urine.  Pain in the lower abdomen or back.  Trouble urinating.  Blood in the urine.  Vomiting or being less hungry than normal.  Diarrhea or abdominal pain.  Vaginal discharge, if you are female.  How is this diagnosed? This condition is diagnosed with a medical history and physical exam. You will also need to provide a urine sample to test your urine. Other tests may be done, including:  Blood tests.  Sexually transmitted disease (STD) testing.  If you have had more than one UTI, a cystoscopy or imaging studies may be done to determine the cause of the infections. How is this treated? Treatment for this condition often includes a combination of two or more of the following:  Antibiotic medicine.  Other medicines to treat less common causes of UTI.  Over-the-counter medicines to treat pain.  Drinking enough water to stay hydrated.  Follow these instructions at home:  Take over-the-counter and prescription medicines only as told by your health care provider.  If you were prescribed an antibiotic, take it as told by your health care provider. Do not stop taking the antibiotic even if you start to feel better.  Avoid alcohol, caffeine, tea, and carbonated beverages. They can irritate your bladder.  Drink enough fluid to keep your urine clear or pale yellow.  Keep all follow-up visits as told by your health care provider. This is important.  Make sure to: ? Empty your bladder often  and completely. Do not hold urine for long periods of time. ? Empty your bladder before and after sex. ? Wipe from front to back after a bowel movement if you are female. Use each tissue one time when you wipe. Contact a health care provider if:  You have back pain.  You have a  fever.  You feel nauseous or vomit.  Your symptoms do not get better after 3 days.  Your symptoms go away and then return. Get help right away if:  You have severe back pain or lower abdominal pain.  You are vomiting and cannot keep down any medicines or water. This information is not intended to replace advice given to you by your health care provider. Make sure you discuss any questions you have with your health care provider. Document Released: 05/11/2005 Document Revised: 01/13/2016 Document Reviewed: 06/22/2015 Elsevier Interactive Patient Education  [employer].

## 2018-04-18 ENCOUNTER — Encounter: Payer: Self-pay | Admitting: Nurse Practitioner

## 2018-04-18 ENCOUNTER — Ambulatory Visit (INDEPENDENT_AMBULATORY_CARE_PROVIDER_SITE_OTHER): Payer: 59 | Admitting: Nurse Practitioner

## 2018-04-18 VITALS — BP 122/86 | HR 93 | Temp 98.1°F | Ht 66.0 in | Wt 207.0 lb

## 2018-04-18 DIAGNOSIS — Z716 Tobacco abuse counseling: Secondary | ICD-10-CM

## 2018-04-18 DIAGNOSIS — Z720 Tobacco use: Secondary | ICD-10-CM | POA: Diagnosis not present

## 2018-04-18 DIAGNOSIS — E039 Hypothyroidism, unspecified: Secondary | ICD-10-CM

## 2018-04-18 DIAGNOSIS — N3001 Acute cystitis with hematuria: Secondary | ICD-10-CM

## 2018-04-18 MED ORDER — NICOTINE 21-14-7 MG/24HR TD KIT: 1.0000 | PACK | Freq: Every day | TRANSDERMAL | 0 refills | Status: DC

## 2018-04-18 MED ORDER — LEVOTHYROXINE SODIUM 75 MCG PO TABS: 75.0000 ug | ORAL_TABLET | Freq: Every day | ORAL | 3 refills | Status: DC

## 2018-04-18 NOTE — Progress Notes (Signed)
Subjective:  Patient ID: Katie Bishop, female    DOB: Feb 17, 1981  Age: 37 y.o. MRN: [id number]  CC: Nicotine Dependence (tobacco use) and Urinary Tract Infection ([facility],still has abd discomfort and frequent urinate. pt is takign abx right now. req culture. )  Nicotine Dependence  Presents for follow-up visit. Her urge triggers include company of smokers. Symptom course: no change: smoke 4cig during work days, and half pack when at home. Her first smoke is from 6 to 8 AM. She smokes < 1/2 a pack of cigarettes per day. Compliance with prior treatments: does not want to use zyban due to possible side effects.  started walking daily, but has not helped to decrease cravings. Never quit before.   UTI:  Evaluated and started on macrobid on Sunday. Persistent Lower Abd fullness and low grade fever-99 per patient, no dysuria, no hematuria, no constipation.  Reviewed past Medical, Social and Family history today.  Outpatient Medications Prior to Visit  Medication Sig Dispense Refill  . albuterol (PROVENTIL HFA;VENTOLIN HFA) 108 (90 Base) MCG/ACT inhaler Inhale 1-2 puffs into the lungs every 6 (six) hours as needed for wheezing or shortness of breath.    . Calcium Carb-Cholecalciferol (CALCIUM 600+D3) 600-800 MG-UNIT TABS Take 1 tablet by mouth daily.    . calcium carbonate (TUMS - DOSED IN MG ELEMENTAL CALCIUM) 500 MG chewable tablet Chew 2 tablets by mouth daily as needed for indigestion or heartburn.    . fexofenadine (ALLEGRA ALLERGY) 180 MG tablet Take 1 tablet (180 mg total) by mouth daily. (Patient taking differently: Take 180 mg by mouth every evening. ) 90 tablet 3  . fluticasone (FLONASE) 50 MCG/ACT nasal spray Place 2 sprays into both nostrils at bedtime.    . furosemide (LASIX) 20 MG tablet Take 0.5 tablets (10 mg total) by mouth daily as needed for edema. 5 tablet 0  . ibuprofen (ADVIL,MOTRIN) 200 MG tablet Take 800 mg by mouth 2 (two) times daily as needed for  headache or moderate pain.    Marland Kitchen LORazepam (ATIVAN) 0.5 MG tablet Take 0.25 mg by mouth daily as needed for anxiety.    . montelukast (SINGULAIR) 10 MG tablet Take 1 tablet (10 mg total) by mouth daily. (Patient taking differently: Take 10 mg by mouth at bedtime. ) 90 tablet 1  . nitrofurantoin, macrocrystal-monohydrate, (MACROBID) 100 MG capsule Take 1 capsule (100 mg total) by mouth 2 (two) times daily for 5 days. 10 capsule 0  . ranitidine (ZANTAC) 150 MG tablet Take 1 tablet (150 mg total) by mouth at bedtime. 90 tablet 3  . sodium chloride (OCEAN) 0.65 % SOLN nasal spray Place 1 spray into both nostrils as needed for congestion. 15 mL 0  . levothyroxine (SYNTHROID, LEVOTHROID) 75 MCG tablet Take 1 tablet (75 mcg total) by mouth daily before breakfast. (Patient taking differently: Take 75 mcg by mouth every evening. ) 90 tablet 1   No facility-administered medications prior to visit.     ROS See HPI  Objective:  BP 122/86   Pulse 93   Temp 98.1 F (36.7 C) (Oral)   Ht '5\' 6"'  (1.676 m)   Wt 207 lb (93.9 kg)   LMP 03/31/2018 (Approximate)   SpO2 98%   BMI 33.41 kg/m   BP Readings from Last 3 Encounters:  04/18/18 122/86  04/15/18 138/82  03/06/18 120/84    Wt Readings from Last 3 Encounters:  04/18/18 207 lb (93.9 kg)  04/15/18 207 lb 12.8 oz (94.3  kg)  03/06/18 209 lb (94.8 kg)    Physical Exam  Constitutional: She is oriented to person, place, and time.  Neck: No thyromegaly present.  Cardiovascular: Normal rate, regular rhythm and normal heart sounds.  Pulmonary/Chest: Effort normal and breath sounds normal.  Neurological: She is alert and oriented to person, place, and time.  Vitals reviewed.   Lab Results  Component Value Date   WBC 6.9 03/05/2018   HGB 13.3 03/05/2018   HCT 39.6 03/05/2018   PLT 359 03/05/2018   GLUCOSE 93 02/14/2018   CHOL 179 02/14/2018   TRIG 181.0 (H) 02/14/2018   HDL 40.10 02/14/2018   LDLCALC 103 (H) 02/14/2018   ALT 18 02/14/2018    AST 13 02/14/2018   NA 137 02/14/2018   K 3.7 02/14/2018   CL 102 02/14/2018   CREATININE 0.73 02/14/2018   BUN 10 02/14/2018   CO2 27 02/14/2018   TSH 4.29 02/14/2018    Assessment & Plan:   Ceasia was seen today for nicotine dependence and urinary tract infection.  Diagnoses and all orders for this visit:  Acute cystitis with hematuria -     Urinalysis w microscopic + reflex cultur  Hypothyroidism, unspecified type -     levothyroxine (SYNTHROID, LEVOTHROID) 75 MCG tablet; Take 1 tablet (75 mcg total) by mouth daily before breakfast.  Tobacco use -     Nicotine 21-14-7 MG/24HR KIT; Place 1 patch onto the skin daily.  Encounter for tobacco use cessation counseling -     Nicotine 21-14-7 MG/24HR KIT; Place 1 patch onto the skin daily.   I am having Katie Bishop start on Nicotine. I am also having her maintain her fexofenadine, ranitidine, montelukast, furosemide, fluticasone, LORazepam, ibuprofen, Calcium Carb-Cholecalciferol, calcium carbonate, albuterol, sodium chloride, nitrofurantoin (macrocrystal-monohydrate), and levothyroxine.  Meds ordered this encounter  Medications  . levothyroxine (SYNTHROID, LEVOTHROID) 75 MCG tablet    Sig: Take 1 tablet (75 mcg total) by mouth daily before breakfast.    Dispense:  90 tablet    Refill:  3    Order Specific Question:   Supervising Provider    Answer:   Lucille Passy [[id number]]  . Nicotine 21-14-7 MG/24HR KIT    Sig: Place 1 patch onto the skin daily.    Dispense:  56 each    Refill:  0    Order Specific Question:   Supervising Provider    Answer:   Lucille Passy [[id number]]    Follow-up: Return in about 6 months (around 10/17/2018) for hypothyroidism.  Wilfred Lacy, NP

## 2018-04-18 NOTE — Patient Instructions (Addendum)
Start nicotine patch when ready to quit.  Check BP at home 2-3x/week.  Continue walking daily, and DASH diet.  Go to lab for urine collection.   Smoking Tobacco Information Smoking tobacco will very likely harm your health. Tobacco contains a poisonous (toxic), colorless chemical called nicotine. Nicotine affects the brain and makes tobacco addictive. This change in your brain can make it hard to stop smoking. Tobacco also has other toxic chemicals that can hurt your body and raise your risk of many cancers. How can smoking tobacco affect me? Smoking tobacco can increase your chances of having serious health conditions, such as:  Cancer. Smoking is most commonly associated with lung cancer, but can lead to cancer in other parts of the body.  Chronic obstructive pulmonary disease (COPD). This is a long-term lung condition that makes it hard to breathe. It also gets worse over time.  High blood pressure (hypertension), heart disease, stroke, or heart attack.  Lung infections, such as pneumonia.  Cataracts. This is when the lenses in the eyes become clouded.  Digestive problems. This may include peptic ulcers, heartburn, and gastroesophageal reflux disease (GERD).  Oral health problems, such as gum disease and tooth loss.  Loss of taste and smell.  Smoking can affect your appearance by causing:  Wrinkles.  Yellow or stained teeth, fingers, and fingernails.  Smoking tobacco can also affect your social life.  Many workplaces, [employer], hotels, and public places are tobacco-free. This means that you may experience challenges in finding places to smoke when away from home.  The cost of a smoking habit can be expensive. Expenses for someone who smokes come in two ways: ? You spend money on a regular basis to buy tobacco. ? Your health care costs in the long-term are higher if you smoke.  Tobacco smoke can also affect the health of those around you. Children of smokers have  greater chances of: ? Sudden infant death syndrome (SIDS). ? Ear infections. ? Lung infections.  What lifestyle changes can be made?  Do not start smoking. Quit if you already do.  To quit smoking: ? Make a plan to quit smoking and commit yourself to it. Look for programs to help you and ask your health care provider for recommendations and ideas. ? Talk with your health care provider about using nicotine replacement medicines to help you quit. Medicine replacement medicines include gum, lozenges, patches, sprays, or pills. ? Do not replace cigarette smoking with electronic cigarettes, which are commonly called e-cigarettes. The safety of e-cigarettes is not known, and some may contain harmful chemicals. ? Avoid places, people, or situations that tempt you to smoke. ? If you try to quit but return to smoking, don't give up hope. It is very common for people to try a number of times before they fully succeed. When you feel ready again, give it another try.  Quitting smoking might affect the way you eat as well as your weight. Be prepared to monitor your eating habits. Get support in planning and following a healthy diet.  Ask your health care provider about having regular tests (screenings) to check for cancer. This may include blood tests, imaging tests, and other tests.  Exercise regularly. Consider taking walks, joining a gym, or doing yoga or exercise classes.  Develop skills to manage your stress. These skills include meditation. What are the benefits of quitting smoking? By quitting smoking, you may:  Lower your risk of getting cancer and other diseases caused by smoking.  Live longer.  Breathe better.  Lower your blood pressure and heart rate.  Stop your addiction to tobacco.  Stop creating secondhand smoke that hurts other people.  Improve your sense of taste and smell.  Look better over time, due to having fewer wrinkles and less staining.  What can happen if changes  are not made? If you do not stop smoking, you may:  Get cancer and other diseases.  Develop COPD or other long-term (chronic) lung conditions.  Develop serious problems with your heart and blood vessels (cardiovascular system).  Need more tests to screen for problems caused by smoking.  Have higher, long-term healthcare costs from medicines or treatments related to smoking.  Continue to have worsening changes in your lungs, mouth, and nose.  Where to find support: To get support to quit smoking, consider:  Asking your health care provider for more information and resources.  Taking classes to learn more about quitting smoking.  Looking for local organizations that offer resources about quitting smoking.  Joining a support group for people who want to quit smoking in your local community.  Where to find more information: You may find more information about quitting smoking from:  HelpGuide.org: www.helpguide.org/articles/addictions/how-to-quit-smoking.htm  BankR[employer]uySmokefree.gov: smokefree.gov  American Lung Association: www.lung.org  Contact a health care provider if:  You have problems breathing.  Your lips, nose, or fingers turn blue.  You have chest pain.  You are coughing up blood.  You feel faint or you pass out.  You have other noticeable changes that cause you to worry. Summary  Smoking tobacco can negatively affect your health, the health of those around you, your finances, and your social life.  Do not start smoking. Quit if you already do. If you need help quitting, ask your health care provider.  Think about joining a support group for people who want to quit smoking in your local community. There are many effective programs that will help you to quit this behavior. This information is not intended to replace advice given to you by your health care provider. Make sure you discuss any questions you have with your health care provider. Document Released:  08/16/2016 Document Revised: 08/16/2016 Document Reviewed: 08/16/2016 Elsevier Interactive Patient Education  [employer]2018 Elsevier Inc.

## 2018-04-20 ENCOUNTER — Encounter (HOSPITAL_COMMUNITY): Payer: Self-pay | Admitting: Emergency Medicine

## 2018-04-20 ENCOUNTER — Emergency Department (HOSPITAL_COMMUNITY)
Admission: EM | Admit: 2018-04-20 | Discharge: 2018-04-20 | Disposition: A | Discharge status: Home / Self Care | Payer: 59 | Attending: Emergency Medicine | Admitting: Emergency Medicine

## 2018-04-20 ENCOUNTER — Other Ambulatory Visit: Payer: Self-pay

## 2018-04-20 ENCOUNTER — Emergency Department (HOSPITAL_COMMUNITY): Payer: 59

## 2018-04-20 DIAGNOSIS — E039 Hypothyroidism, unspecified: Secondary | ICD-10-CM | POA: Insufficient documentation

## 2018-04-20 DIAGNOSIS — F17210 Nicotine dependence, cigarettes, uncomplicated: Secondary | ICD-10-CM | POA: Diagnosis not present

## 2018-04-20 DIAGNOSIS — R42 Dizziness and giddiness: Secondary | ICD-10-CM | POA: Diagnosis not present

## 2018-04-20 DIAGNOSIS — R1031 Right lower quadrant pain: Secondary | ICD-10-CM | POA: Diagnosis not present

## 2018-04-20 DIAGNOSIS — Z79899 Other long term (current) drug therapy: Secondary | ICD-10-CM | POA: Insufficient documentation

## 2018-04-20 DIAGNOSIS — R109 Unspecified abdominal pain: Secondary | ICD-10-CM | POA: Diagnosis not present

## 2018-04-20 LAB — CBC
HEMATOCRIT: 42.3 % (ref 36.0–46.0)
Hemoglobin: 13.5 g/dL (ref 12.0–15.0)
MCH: 29.4 pg (ref 26.0–34.0)
MCHC: 31.9 g/dL (ref 30.0–36.0)
MCV: 92.2 fL (ref 78.0–100.0)
PLATELETS: 420 10*3/uL — AB (ref 150–400)
RBC: 4.59 MIL/uL (ref 3.87–5.11)
RDW: 13.5 % (ref 11.5–15.5)
WBC: 6.7 10*3/uL (ref 4.0–10.5)

## 2018-04-20 LAB — URINALYSIS, ROUTINE W REFLEX MICROSCOPIC
BILIRUBIN URINE: NEGATIVE
Glucose, UA: NEGATIVE mg/dL
Hgb urine dipstick: NEGATIVE
KETONES UR: NEGATIVE mg/dL
NITRITE: NEGATIVE
Protein, ur: NEGATIVE mg/dL
SPECIFIC GRAVITY, URINE: 1.01 (ref 1.005–1.030)
pH: 6 (ref 5.0–8.0)

## 2018-04-20 LAB — BASIC METABOLIC PANEL
Anion gap: 9 (ref 5–15)
BUN: 5 mg/dL — AB (ref 6–20)
CALCIUM: 9.1 mg/dL (ref 8.9–10.3)
CO2: 25 mmol/L (ref 22–32)
Chloride: 105 mmol/L (ref 98–111)
Creatinine, Ser: 0.88 mg/dL (ref 0.44–1.00)
GFR calc Af Amer: >60 mL/min (ref >60)
GLUCOSE: 99 mg/dL (ref 70–99)
Potassium: 3.8 mmol/L (ref 3.5–5.1)
SODIUM: 139 mmol/L (ref 135–145)

## 2018-04-20 LAB — URINALYSIS W MICROSCOPIC + REFLEX CULTURE
BACTERIA UA: NONE SEEN /HPF
Bilirubin Urine: NEGATIVE
Glucose, UA: NEGATIVE
HYALINE CAST: NONE SEEN /LPF
Hgb urine dipstick: NEGATIVE
Ketones, ur: NEGATIVE
Nitrites, Initial: NEGATIVE
Protein, ur: NEGATIVE
Specific Gravity, Urine: 1.017 (ref 1.001–1.03)
pH: 5.5 (ref 5.0–8.0)

## 2018-04-20 LAB — CULTURE INDICATED

## 2018-04-20 LAB — URINE CULTURE
MICRO NUMBER:: 91061725
SPECIMEN QUALITY:: ADEQUATE

## 2018-04-20 LAB — HCG, QUANTITATIVE, PREGNANCY

## 2018-04-20 MED ORDER — IOPAMIDOL (ISOVUE-300) INJECTION 61%
INTRAVENOUS | Status: AC
  Filled 2018-04-20: qty 100

## 2018-04-20 MED ORDER — IOPAMIDOL (ISOVUE-300) INJECTION 61%
100.0000 mL | Freq: Once | INTRAVENOUS | Status: AC | PRN
  Administered 2018-04-20: 100 mL via INTRAVENOUS

## 2018-04-20 MED ORDER — SODIUM CHLORIDE 0.9 % IV BOLUS
1000.0000 mL | Freq: Once | INTRAVENOUS | Status: AC
  Administered 2018-04-20: 1000 mL via INTRAVENOUS

## 2018-04-20 NOTE — ED Triage Notes (Signed)
Pt presents to ED for assessment after being diagnosed with a kidney infection on 9/1.  Patient symptoms and condition not improving, today patient became dizzy and light-headed at work.  States she has not been able to recover since with malaise and fatigue.  C/o fever and chills at home.  Urine has been sent off for culture, but no results yet.

## 2018-04-20 NOTE — Discharge Instructions (Addendum)
Home to rest, drink plenty of fluids. Up with your PCP regarding urine culture report. Return to ER for worsening or concerning symptoms.

## 2018-04-20 NOTE — ED Provider Notes (Signed)
[facility] Provider Note   CSN: [id number] Arrival date & time: 04/20/18  1238     History   Chief Complaint Chief Complaint  Patient presents with  . Dizziness    HPI Katie Bishop is a 37 y.o. female.  37 year old female presents with complaint of episode of dizziness today.  Patient states that on August 26 she began experiencing right lower back pain which is similar to how her symptoms of urinary tract infection start, developed urgency on August 28, went to urgent care on Sunday and was diagnosed with urinary tract infection, given prescription for 5 days of Macrobid.  Patient returned to her PCP on September 4 for ongoing symptoms, urine culture was sent and is pending.  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  Patient reports lower abdominal discomfort, worse on the right with right lower back pain.  States she has had loose stools for the past few days.  Has changes in appetite, did have breakfast today and was sipping on a soda prior to her episode while at work.  Previous abdominal surgeries include left ectopic pregnancy, removal of her left fallopian tube, right fallopian tube and right ovary.  Patient states as a child she had frequent right lower quadrant pain, did have arthroscopic surgery at one point to evaluate for possible chronic appendicitis but was told this was normal and did not remove her appendix. Denies fevers, chills, vomiting, vaginal discharge, dysuria, hematuria, history of kidney stones. No other complaints or concerns.      Past Medical History:  Diagnosis Date  . Allergy   . Anxiety   . GERD (gastroesophageal reflux disease)   . Hypothyroidism   . IBS (irritable bowel syndrome)   . Other abnormalities of heart beat    heart arrhythmai PACs PVCs  . PVC's (premature ventricular contractions)   . Seasonal allergies   . SVD  (spontaneous vaginal delivery)    x 2 at [place]  . Thyroid disease     Patient Active Problem List   Diagnosis Date Noted  . Gastroesophageal reflux disease 02/14/2018  . Allergic rhinitis 02/14/2018  . Tobacco use 02/14/2018  . Hypothyroidism 02/14/2018  . Vitamin D deficiency 02/14/2018  . Menorrhagia with irregular cycle 02/14/2018    Past Surgical History:  Procedure Laterality Date  . CHOLECYSTECTOMY  2013   [place]  . COLONOSCOPY    . DIAGNOSTIC LAPAROSCOPY     x 2 - diagnostic/cysts  . [place]ECTOMY Right [place]  . OTHER SURGICAL HISTORY  2016   sinus surgery in [place]  . TUBAL LIGATION Bilateral 2017  . UPPER GI ENDOSCOPY    . WISDOM TOOTH EXTRACTION       OB History   None      Home Medications    Prior to Admission medications   Medication Sig Start Date End Date Taking? Authorizing Provider  albuterol (PROVENTIL HFA;VENTOLIN HFA) 108 (90 Base) MCG/ACT inhaler Inhale 1-2 puffs into the lungs every 6 (six) hours as needed for wheezing or shortness of breath.   Yes [provider]  Calcium Carb-Cholecalciferol (CALCIUM 600+D3) 600-800 MG-UNIT TABS Take 1 tablet by mouth daily.   Yes [provider]  calcium carbonate (TUMS - DOSED IN MG ELEMENTAL CALCIUM) 500 MG chewable tablet Chew 2 tablets by mouth daily as needed for indigestion or heartburn.   Yes [provider]  fexofenadine (ALLEGRA ALLERGY) 180 MG tablet Take 1 tablet (180 mg total) by mouth daily. Patient taking differently: Take 180 mg by mouth at bedtime.  02/14/18  Yes Nche, Charlene Brooke, NP  fluticasone (FLONASE) 50 MCG/ACT nasal spray Place 2 sprays into both nostrils at bedtime.   Yes [provider]  furosemide (LASIX) 20 MG tablet Take 0.5 tablets (10 mg total) by mouth daily as needed for edema. 02/16/18  Yes Nche, Charlene Brooke, NP  ibuprofen (ADVIL,MOTRIN) 200 MG tablet Take 800 mg by mouth 2 (two) times daily as needed for headache or  moderate pain.   Yes [provider]  levothyroxine (SYNTHROID, LEVOTHROID) 75 MCG tablet Take 1 tablet (75 mcg total) by mouth daily before breakfast. Patient taking differently: Take 75 mcg by mouth at bedtime.  04/18/18  Yes Nche, Charlene Brooke, NP  LORazepam (ATIVAN) 0.5 MG tablet Take 0.25 mg by mouth daily as needed for anxiety.   Yes [provider]  montelukast (SINGULAIR) 10 MG tablet Take 1 tablet (10 mg total) by mouth daily. Patient taking differently: Take 10 mg by mouth at bedtime.  02/14/18  Yes Nche, Charlene Brooke, NP  nitrofurantoin, macrocrystal-monohydrate, (MACROBID) 100 MG capsule Take 1 capsule (100 mg total) by mouth 2 (two) times daily for 5 days. 04/15/18 04/20/18 Yes Kara Dies, NP  ranitidine (ZANTAC) 150 MG tablet Take 1 tablet (150 mg total) by mouth at bedtime. 02/14/18  Yes Nche, Charlene Brooke, NP  sodium chloride (OCEAN) 0.65 % SOLN nasal spray Place 1 spray into both nostrils as needed for congestion. 03/06/18  Yes Nche, Charlene Brooke, NP  Nicotine 21-14-7 MG/24HR KIT Place 1 patch onto the skin daily. 04/18/18   Nche, Charlene Brooke, NP    Family History Family History  Problem Relation Age of Onset  . Hyperlipidemia Mother   . Hypertension Mother   . Mitral valve prolapse Mother   . Hypothyroidism Mother   . Hyperlipidemia Father   . Multiple myeloma Maternal Grandmother   . Anuerysm Paternal Grandmother        brain  . Cancer Paternal Grandfather        lung cancer  . Cancer Maternal Aunt        breast cancer  . Cancer Maternal Aunt        breast cancer  . Cancer Maternal Aunt        breast cancer    Social History Social History   Tobacco Use  . Smoking status: Current Every Day Smoker    Packs/day: 0.50    Years: 18.00    Pack years: 9.00    Types: Cigarettes  . Smokeless tobacco: Never Used  Substance Use Topics  . Alcohol use: Yes    Comment: social   . Drug use: Never     Allergies   Ciprofloxacin;  Fluocinolone; Sulfamethoxazole-trimethoprim; and Tetanus toxoids   Review of Systems Review of Systems  Constitutional: Positive for diaphoresis. Negative for chills and fever.  Respiratory: Negative for shortness of breath.   Cardiovascular: Negative for chest pain.  Gastrointestinal: Positive for abdominal pain and nausea. Negative for abdominal distention, constipation, diarrhea and vomiting.  Genitourinary: Positive for frequency. Negative for dysuria and hematuria.  Musculoskeletal: Positive for back pain. Negative for arthralgias and myalgias.  Skin: Negative for rash and wound.  Allergic/Immunologic: Negative for immunocompromised state.  Neurological: Positive for dizziness. Negative for weakness.  Hematological: Does not bruise/bleed easily.  Psychiatric/Behavioral: Negative for confusion.  All other systems  reviewed and are negative.    Physical Exam Updated Vital Signs BP 134/85   Pulse 82   Temp 98.8 F (37.1 C) (Oral)   Resp 13   LMP 03/31/2018 (Approximate)   SpO2 98%   Physical Exam  Constitutional: She is oriented to person, place, and time. She appears well-developed and well-nourished. No distress.  HENT:  Head: Normocephalic and atraumatic.  Eyes: Conjunctivae are normal.  Cardiovascular: Normal rate, regular rhythm, normal heart sounds and intact distal pulses.  No murmur heard. Pulmonary/Chest: Effort normal and breath sounds normal. No respiratory distress.  Abdominal: Soft. She exhibits no distension. There is tenderness in the right lower quadrant. There is guarding.  Musculoskeletal:       Back:  Neurological: She is alert and oriented to person, place, and time.  Skin: Skin is warm and dry. She is not diaphoretic.  Psychiatric: She has a normal mood and affect. Her behavior is normal.  Nursing note and vitals reviewed.    ED Treatments / Results  Labs (all labs ordered are listed, but only abnormal results are displayed) Labs Reviewed    BASIC METABOLIC PANEL - Abnormal; Notable for the following components:      Result Value   BUN 5 (*)    All other components within normal limits  CBC - Abnormal; Notable for the following components:   Platelets 420 (*)    All other components within normal limits  URINALYSIS, ROUTINE W REFLEX MICROSCOPIC - Abnormal; Notable for the following components:   APPearance HAZY (*)    Leukocytes, UA SMALL (*)    Bacteria, UA FEW (*)    All other components within normal limits  HCG, QUANTITATIVE, PREGNANCY  I-STAT BETA HCG BLOOD, ED ([facility], [facility], [facility] ONLY)    EKG EKG Interpretation  Date/Time:  Friday April 20 2018 16:26:54 EDT Ventricular Rate:  79 PR Interval:    QRS Duration: 100 QT Interval:  389 QTC Calculation: 446 R Axis:   -62 Text Interpretation:  Sinus rhythm LAD, consider left anterior fascicular block Abnormal R-wave progression, late transition Borderline T wave abnormalities Baseline wander in lead(s) V4 No STEMI.  Confirmed by Nanda Quinton [phone]) on 04/20/2018 5:08:07 PM   Radiology Ct Abdomen Pelvis W Contrast  Result Date: 04/20/2018 CLINICAL DATA:  Abdominal pain, appendicitis is suspected. The patient states recent kidney infection. EXAM: CT ABDOMEN AND PELVIS WITH CONTRAST TECHNIQUE: Multidetector CT imaging of the abdomen and pelvis was performed using the standard protocol following bolus administration of intravenous contrast. CONTRAST:  180m ISOVUE-300 IOPAMIDOL (ISOVUE-300) INJECTION 61% COMPARISON:  None. FINDINGS: Lower chest: The lung bases are clear without focal nodule, mass, or airspace disease. The heart size is normal. No significant pleural or pericardial effusion is present. Hepatobiliary: No focal liver abnormality is seen. Status post cholecystectomy. No biliary dilatation. Pancreas: Unremarkable. No pancreatic ductal dilatation or surrounding inflammatory changes. Spleen: Normal in size without focal abnormality. Adrenals/Urinary Tract: Adrenal glands  are normal bilaterally. Kidneys and ureters are unremarkable. There is no stone or mass lesion. No obstructive uropathy is present. Renal parenchymal enhancement is within normal limits bilaterally. The urinary bladder is normal. Stomach/Bowel: Stomach and duodenum are within normal limits. Small bowel is unremarkable. Terminal ileum is within normal limits. The appendix is visualized and normal. The ascending and transverse colon are normal. The descending colon is within normal limits. 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 No associated inflammatory change is present. Vascular/Lymphatic: No significant vascular findings are present. No enlarged  abdominal or pelvic lymph nodes. Reproductive: Uterus and bilateral adnexa are unremarkable. Other: No abdominal wall hernia or abnormality. No abdominopelvic ascites. Musculoskeletal: Vertebral body heights and alignment are normal. No focal lytic or blastic lesions are present. Bony pelvis is within normal limits. Hips are located and normal. Rightward curvature centered at the thoracolumbar junction. IMPRESSION: 1. Negative CT of the abdomen pelvis. No acute or focal lesion to explain abdominal pain. The appendix is normal. Kidneys are unremarkable. Electronically Signed   By: San Morelle M.D.   On: 04/20/2018 18:21    Procedures Procedures (including critical care time)  Medications Ordered in ED Medications  iopamidol (ISOVUE-300) 61 % injection (has no administration in time range)  sodium chloride 0.9 % bolus 1,000 mL (0 mLs Intravenous Stopped 04/20/18 1902)  iopamidol (ISOVUE-300) 61 % injection 100 mL (100 mLs Intravenous Contrast Given 04/20/18 1801)     Initial Impression / Assessment and Plan / ED Course  I have reviewed the triage vital signs and the nursing notes.  Pertinent labs & imaging results that were available during my care of the patient were reviewed by me and considered in my medical decision making (see chart  for details).  Clinical Course as of Apr 20 2009  Fri Apr 20, 5354  [id number] 37 year old female presents with complaint of urinary frequency with abdominal pain, had a episode at work today where she felt clammy and dizzy, nauseous.  She is currently taking Macrobid for a urinary tract infection, does not seem to be improving with antibiotics, culture is pending, no growth after 2 days.  On exam patient has tenderness in the right lower quadrant with guarding, also has right lower back tenderness.  CT abdomen pelvis ordered for further evaluation, consider possible appendicitis.  CT is unremarkable, review of labs shows normal CBC, normal BMP, negative pregnancy test, urinalysis with hazy appearance, small leukocytes, few bacteria, contaminated sample.  Patient feels better after fluids in the emergency room.  Orthostatics are unremarkable.  Plan is to follow-up with PCP, return to ER as needed.   [LM]    Clinical Course User Index [LM] Tacy Learn, PA-C    Final Clinical Impressions(s) / ED Diagnoses   Final diagnoses:  Dizziness  Right lower quadrant abdominal pain    ED Discharge Orders    None       Roque Lias 04/20/18 2010    Margette Fast, MD 04/21/18 [phone]

## 2018-04-20 NOTE — ED Notes (Signed)
Patient given discharge instructions and verbalized understanding.  Patient stable to discharge at this time.  Patient is alert and oriented to baseline.  No distressed noted at this time.  All belongings taken with the patient at discharge.   

## 2018-04-23 ENCOUNTER — Encounter: Payer: Self-pay | Admitting: Nurse Practitioner

## 2018-04-23 ENCOUNTER — Ambulatory Visit: Payer: 59 | Admitting: Nurse Practitioner

## 2018-04-23 ENCOUNTER — Ambulatory Visit (HOSPITAL_BASED_OUTPATIENT_CLINIC_OR_DEPARTMENT_OTHER)
Admission: RE | Admit: 2018-04-23 | Discharge: 2018-04-23 | Disposition: A | Discharge status: Home / Self Care | Payer: 59 | Source: Ambulatory Visit | Attending: Nurse Practitioner | Admitting: Nurse Practitioner

## 2018-04-23 DIAGNOSIS — G4452 New daily persistent headache (NDPH): Secondary | ICD-10-CM | POA: Insufficient documentation

## 2018-04-23 DIAGNOSIS — H81399 Other peripheral vertigo, unspecified ear: Secondary | ICD-10-CM | POA: Diagnosis not present

## 2018-04-23 DIAGNOSIS — R51 Headache: Secondary | ICD-10-CM | POA: Diagnosis not present

## 2018-04-23 MED ORDER — SUMATRIPTAN SUCCINATE 25 MG PO TABS: 25.0000 mg | ORAL_TABLET | Freq: Two times a day (BID) | ORAL | 1 refills | Status: DC | PRN

## 2018-04-23 MED ORDER — PROMETHAZINE HCL 25 MG PO TABS: 25.0000 mg | ORAL_TABLET | Freq: Three times a day (TID) | ORAL | 0 refills | Status: DC | PRN

## 2018-04-23 MED FILL — PROMETHAZINE 25 MG TABLET: 25 | 7 days supply | Qty: 20 | Fill #0

## 2018-04-23 MED FILL — SUMATRIPTAN SUCC 25 MG TAB: 25 | 15 days supply | Qty: 8 | Fill #0

## 2018-04-23 NOTE — Progress Notes (Signed)
Subjective:  Patient ID: Katie Bishop, female    DOB: May 04, 1981  Age: 37 y.o. MRN: [id number]  CC: Follow-up (ER follow up on dizzieness. today dizzy and headache,its get worse when stand up or lay down. ER advised her to stop macrobid and drink alot of water. out of work note consult?)   Headache   This is a new problem. The current episode started in the past 7 days. The problem has been gradually worsening. The pain is located in the frontal and vertex region. The pain does not radiate. The pain quality is not similar to prior headaches. The quality of the pain is described as throbbing (pressure). Associated symptoms include anorexia, dizziness, nausea, sinus pressure, tinnitus and a visual change. Pertinent negatives include no abnormal behavior, back pain, blurred vision, coughing, drainage, ear pain, eye pain, eye redness, eye watering, facial sweating, fever, hearing loss, insomnia, loss of balance, muscle aches, neck pain, numbness, phonophobia, photophobia, rhinorrhea, scalp tenderness, seizures, sore throat, swollen glands, tingling or vomiting. Nothing aggravates the symptoms. She has tried NSAIDs (and meclizine) for the symptoms. The treatment provided mild relief.  evaluated in ED 04/2018: no acute finding. Reviewed labs and CT ABD/pelvis.  Reviewed past Medical, Social and Family history today.  Outpatient Medications Prior to Visit  Medication Sig Dispense Refill  . albuterol (PROVENTIL HFA;VENTOLIN HFA) 108 (90 Base) MCG/ACT inhaler Inhale 1-2 puffs into the lungs every 6 (six) hours as needed for wheezing or shortness of breath.    . Calcium Carb-Cholecalciferol (CALCIUM 600+D3) 600-800 MG-UNIT TABS Take 1 tablet by mouth daily.    . calcium carbonate (TUMS - DOSED IN MG ELEMENTAL CALCIUM) 500 MG chewable tablet Chew 2 tablets by mouth daily as needed for indigestion or heartburn.    . fexofenadine (ALLEGRA ALLERGY) 180 MG tablet Take 1 tablet (180 mg total) by mouth daily.  (Patient taking differently: Take 180 mg by mouth at bedtime. ) 90 tablet 3  . fluticasone (FLONASE) 50 MCG/ACT nasal spray Place 2 sprays into both nostrils at bedtime.    Marland Kitchen ibuprofen (ADVIL,MOTRIN) 200 MG tablet Take 800 mg by mouth 2 (two) times daily as needed for headache or moderate pain.    Marland Kitchen levothyroxine (SYNTHROID, LEVOTHROID) 75 MCG tablet Take 1 tablet (75 mcg total) by mouth daily before breakfast. (Patient taking differently: Take 75 mcg by mouth at bedtime. ) 90 tablet 3  . LORazepam (ATIVAN) 0.5 MG tablet Take 0.25 mg by mouth daily as needed for anxiety.    . montelukast (SINGULAIR) 10 MG tablet Take 1 tablet (10 mg total) by mouth daily. (Patient taking differently: Take 10 mg by mouth at bedtime. ) 90 tablet 1  . Nicotine 21-14-7 MG/24HR KIT Place 1 patch onto the skin daily. 56 each 0  . ranitidine (ZANTAC) 150 MG tablet Take 1 tablet (150 mg total) by mouth at bedtime. 90 tablet 3  . furosemide (LASIX) 20 MG tablet Take 0.5 tablets (10 mg total) by mouth daily as needed for edema. (Patient not taking: Reported on 04/23/2018) 5 tablet 0  . sodium chloride (OCEAN) 0.65 % SOLN nasal spray Place 1 spray into both nostrils as needed for congestion. (Patient not taking: Reported on 04/23/2018) 15 mL 0   No facility-administered medications prior to visit.     ROS See HPI  Objective:  BP 126/86   Pulse 88   Temp 98.7 F (37.1 C) (Oral)   Ht '5\' 6"'  (1.676 m)   Wt 208 lb (94.3 kg)  LMP 03/31/2018 (Approximate)   SpO2 97%   BMI 33.57 kg/m   BP Readings from Last 3 Encounters:  04/23/18 126/86  04/20/18 134/85  04/18/18 122/86    Wt Readings from Last 3 Encounters:  04/23/18 208 lb (94.3 kg)  04/18/18 207 lb (93.9 kg)  04/15/18 207 lb 12.8 oz (94.3 kg)    Physical Exam  Constitutional: She is oriented to person, place, and time.  HENT:  Right Ear: External ear normal.  Left Ear: External ear normal.  Nose: Nose normal. No mucosal edema or rhinorrhea.    Mouth/Throat: Oropharynx is clear and moist.  Eyes: Pupils are equal, round, and reactive to light. Conjunctivae and EOM are normal.  Neck: Normal range of motion. Neck supple. No thyromegaly present.  Cardiovascular: Normal rate, regular rhythm and normal heart sounds.  Pulmonary/Chest: Effort normal.  Neurological: She is alert and oriented to person, place, and time. No cranial nerve deficit.  Psychiatric: She has a normal mood and affect. Her behavior is normal. Thought content normal.  Vitals reviewed.  Lab Results  Component Value Date   WBC 6.7 04/20/2018   HGB 13.5 04/20/2018   HCT 42.3 04/20/2018   PLT 420 (H) 04/20/2018   GLUCOSE 99 04/20/2018   CHOL 179 02/14/2018   TRIG 181.0 (H) 02/14/2018   HDL 40.10 02/14/2018   LDLCALC 103 (H) 02/14/2018   ALT 18 02/14/2018   AST 13 02/14/2018   NA 139 04/20/2018   K 3.8 04/20/2018   CL 105 04/20/2018   CREATININE 0.88 04/20/2018   BUN 5 (L) 04/20/2018   CO2 25 04/20/2018   TSH 4.29 02/14/2018    Ct Abdomen Pelvis W Contrast  Result Date: 04/20/2018 CLINICAL DATA:  Abdominal pain, appendicitis is suspected. The patient states recent kidney infection. EXAM: CT ABDOMEN AND PELVIS WITH CONTRAST TECHNIQUE: Multidetector CT imaging of the abdomen and pelvis was performed using the standard protocol following bolus administration of intravenous contrast. CONTRAST:  158m ISOVUE-300 IOPAMIDOL (ISOVUE-300) INJECTION 61% COMPARISON:  None. FINDINGS: Lower chest: The lung bases are clear without focal nodule, mass, or airspace disease. The heart size is normal. No significant pleural or pericardial effusion is present. Hepatobiliary: No focal liver abnormality is seen. Status post cholecystectomy. No biliary dilatation. Pancreas: Unremarkable. No pancreatic ductal dilatation or surrounding inflammatory changes. Spleen: Normal in size without focal abnormality. Adrenals/Urinary Tract: Adrenal glands are normal bilaterally. Kidneys and ureters  are unremarkable. There is no stone or mass lesion. No obstructive uropathy is present. Renal parenchymal enhancement is within normal limits bilaterally. The urinary bladder is normal. Stomach/Bowel: Stomach and duodenum are within normal limits. Small bowel is unremarkable. Terminal ileum is within normal limits. The appendix is visualized and normal. The ascending and transverse colon are normal. The descending colon is within normal limits. 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 No associated inflammatory change is present. Vascular/Lymphatic: No significant vascular findings are present. No enlarged abdominal or pelvic lymph nodes. Reproductive: Uterus and bilateral adnexa are unremarkable. Other: No abdominal wall hernia or abnormality. No abdominopelvic ascites. Musculoskeletal: Vertebral body heights and alignment are normal. No focal lytic or blastic lesions are present. Bony pelvis is within normal limits. Hips are located and normal. Rightward curvature centered at the thoracolumbar junction. IMPRESSION: 1. Negative CT of the abdomen pelvis. No acute or focal lesion to explain abdominal pain. The appendix is normal. Kidneys are unremarkable. Electronically Signed   By: CSan MorelleM.D.   On: 04/20/2018 18:21  Assessment & Plan:   Cora was seen today for follow-up.  Diagnoses and all orders for this visit:  New daily persistent headache -     CT Head Wo Contrast; Future -     promethazine (PHENERGAN) 25 MG tablet; Take 1 tablet (25 mg total) by mouth every 8 (eight) hours as needed for nausea or vomiting. -     SUMAtriptan (IMITREX) 25 MG tablet; Take 1-2 tablets (25-50 mg total) by mouth every 12 (twelve) hours as needed for migraine or headache. Do not take more that 4tabs in 24hrs  Other peripheral vertigo, unspecified ear -     CT Head Wo Contrast; Future -     promethazine (PHENERGAN) 25 MG tablet; Take 1 tablet (25 mg total) by mouth every 8 (eight) hours  as needed for nausea or vomiting.   I am having Katie Bishop start on promethazine and SUMAtriptan. I am also having her maintain her fexofenadine, ranitidine, montelukast, furosemide, fluticasone, LORazepam, ibuprofen, Calcium Carb-Cholecalciferol, calcium carbonate, albuterol, sodium chloride, levothyroxine, and Nicotine.  Meds ordered this encounter  Medications  . promethazine (PHENERGAN) 25 MG tablet    Sig: Take 1 tablet (25 mg total) by mouth every 8 (eight) hours as needed for nausea or vomiting.    Dispense:  20 tablet    Refill:  0    Order Specific Question:   Supervising Provider    Answer:   Lucille Passy [[id number]]  . SUMAtriptan (IMITREX) 25 MG tablet    Sig: Take 1-2 tablets (25-50 mg total) by mouth every 12 (twelve) hours as needed for migraine or headache. Do not take more that 4tabs in 24hrs    Dispense:  8 tablet    Refill:  1    Order Specific Question:   Supervising Provider    Answer:   Lucille Passy [[id number]]    Follow-up: Return if symptoms worsen or fail to improve.  Wilfred Lacy, NP

## 2018-04-23 NOTE — Patient Instructions (Addendum)
Normal head CT. Use imitrex and promethazine as prescribed. Call office if no improvement in 2days.  Vertigo Vertigo is the feeling that you or your surroundings are moving when they are not. Vertigo can be dangerous if it occurs while you are doing something that could endanger you or others, such as driving. What are the causes? This condition is caused by a disturbance in the signals that are sent by your body's sensory systems to your brain. Different causes of a disturbance can lead to vertigo, including:  Infections, especially in the inner ear.  A bad reaction to a drug, or misuse of alcohol and medicines.  Withdrawal from drugs or alcohol.  Quickly changing positions, as when lying down or rolling over in bed.  Migraine headaches.  Decreased blood flow to the brain.  Decreased blood pressure.  Increased pressure in the brain from a head or neck injury, stroke, infection, tumor, or bleeding.  Central nervous system disorders.  What are the signs or symptoms? Symptoms of this condition usually occur when you move your head or your eyes in different directions. Symptoms may start suddenly, and they usually last for less than a minute. Symptoms may include:  Loss of balance and falling.  Feeling like you are spinning or moving.  Feeling like your surroundings are spinning or moving.  Nausea and vomiting.  Blurred vision or double vision.  Difficulty hearing.  Slurred speech.  Dizziness.  Involuntary eye movement (nystagmus).  Symptoms can be mild and cause only slight annoyance, or they can be severe and interfere with daily life. Episodes of vertigo may return (recur) over time, and they are often triggered by certain movements. Symptoms may improve over time. How is this diagnosed? This condition may be diagnosed based on medical history and the quality of your nystagmus. Your health care provider may test your eye movements by asking you to quickly change  positions to trigger the nystagmus. This may be called the Dix-Hallpike test, head thrust test, or roll test. You may be referred to a health care provider who specializes in ear, nose, and throat (ENT) problems (otolaryngologist) or a provider who specializes in disorders of the central nervous system (neurologist). You may have additional testing, including:  A physical exam.  Blood tests.  MRI.  A CT scan.  An electrocardiogram (ECG). This records electrical activity in your heart.  An electroencephalogram (EEG). This records electrical activity in your brain.  Hearing tests.  How is this treated? Treatment for this condition depends on the cause and the severity of the symptoms. Treatment options include:  Medicines to treat nausea or vertigo. These are usually used for severe cases. Some medicines that are used to treat other conditions may also reduce or eliminate vertigo symptoms. These include: ? Medicines that control allergies (antihistamines). ? Medicines that control seizures (anticonvulsants). ? Medicines that relieve depression (antidepressants). ? Medicines that relieve anxiety (sedatives).  Head movements to adjust your inner ear back to normal. If your vertigo is caused by an ear problem, your health care provider may recommend certain movements to correct the problem.  Surgery. This is rare.  Follow these instructions at home: Safety  Move slowly.Avoid sudden body or head movements.  Avoid driving.  Avoid operating heavy machinery.  Avoid doing any tasks that would cause danger to you or others if you would have a vertigo episode during the task.  If you have trouble walking or keeping your balance, try using a cane for stability. If you feel  dizzy or unstable, sit down right away.  Return to your normal activities as told by your health care provider. Ask your health care provider what activities are safe for you. General instructions  Take  over-the-counter and prescription medicines only as told by your health care provider.  Avoid certain positions or movements as told by your health care provider.  Drink enough fluid to keep your urine clear or pale yellow.  Keep all follow-up visits as told by your health care provider. This is important. Contact a health care provider if:  Your medicines do not relieve your vertigo or they make it worse.  You have a fever.  Your condition gets worse or you develop new symptoms.  Your family or friends notice any behavioral changes.  Your nausea or vomiting gets worse.  You have numbness or a "pins and needles" sensation in part of your body. Get help right away if:  You have difficulty moving or speaking.  You are always dizzy.  You faint.  You develop severe headaches.  You have weakness in your hands, arms, or legs.  You have changes in your hearing or vision.  You develop a stiff neck.  You develop sensitivity to light. This information is not intended to replace advice given to you by your health care provider. Make sure you discuss any questions you have with your health care provider. Document Released: 05/11/2005 Document Revised: 01/13/2016 Document Reviewed: 11/24/2014 Elsevier Interactive Patient Education  [employer].

## 2018-04-25 ENCOUNTER — Encounter: Payer: Self-pay | Admitting: Nurse Practitioner

## 2018-04-25 ENCOUNTER — Ambulatory Visit: Payer: Self-pay | Admitting: *Deleted

## 2018-04-25 DIAGNOSIS — H9313 Tinnitus, bilateral: Secondary | ICD-10-CM

## 2018-04-25 DIAGNOSIS — H81399 Other peripheral vertigo, unspecified ear: Secondary | ICD-10-CM

## 2018-04-25 DIAGNOSIS — R42 Dizziness and giddiness: Secondary | ICD-10-CM | POA: Diagnosis not present

## 2018-04-25 NOTE — Telephone Encounter (Signed)
Pt reports seen in ED 04/20/18 for dizziness, headache, and by C. Nche  04/23/18. Dx with vertigo.  Reports "Spinning" this am with bending over after showering, resulting in a fall. States no injuries from fall, "Lowered myself to the floor." States did not pass out. Presently lightheaded. Denies headache; took Imitrex yesterday am. Pt states C. Nche mentioned an ENT referral at [facility] and connected pt to nurse at practice for further guidance with disposition.  Reason for Disposition . [1] MODERATE dizziness (e.g., vertigo; feels very unsteady, interferes with normal activities) AND [2] has been evaluated by physician for this  Answer Assessment - Initial Assessment Questions 1. DESCRIPTION: "Describe your dizziness."    Lightheadedness 2. VERTIGO: "Do you feel like either you or the room is spinning or tilting?"      1 episode this am 3. LIGHTHEADED: "Do you feel lightheaded?" (e.g., somewhat faint, woozy, weak upon standing)   yes 4. SEVERITY: "How bad is it?"  "Can you walk?"   - MILD - Feels unsteady but walking normally.   - MODERATE - Feels very unsteady when walking, but not falling; interferes with normal activities (e.g., school, work) .   - SEVERE - Unable to walk without falling (requires assistance).     Fell with episode this am 5. ONSET:  "When did the dizziness begin?"     Seen in ED 04/20/18  At PCP 04/23/18 6. AGGRAVATING FACTORS: "Does anything make it worse?" (e.g., standing, change in head position)     Bending over 7. CAUSE: "What do you think is causing the dizziness?"    Vertigo DX 8. RECURRENT SYMPTOM: "Have you had dizziness before?" If so, ask: "When was the last time?" "What happened that time?"    Yes, 04/20/18 9. OTHER SYMPTOMS: "Do you have any other symptoms?" (e.g., headache, weakness, numbness, vomiting, earache)    no  Protocols used: DIZZINESS - VERTIGO-A-AH

## 2018-04-25 NOTE — Telephone Encounter (Signed)
Spoke with pt advise her to take meclizine, change position slowly, maintain adequate oral hydration. We will enter referral for PT for vertigo exercise as well.

## 2018-04-25 NOTE — Telephone Encounter (Signed)
[place] please advise, ok for out of work note?   Referral---please help assist with this request.

## 2018-04-30 DIAGNOSIS — H81399 Other peripheral vertigo, unspecified ear: Secondary | ICD-10-CM | POA: Diagnosis not present

## 2018-04-30 DIAGNOSIS — R42 Dizziness and giddiness: Secondary | ICD-10-CM | POA: Diagnosis not present

## 2018-04-30 MED ORDER — PREDNISONE 10 MG (21) PO TBPK: ORAL_TABLET | ORAL | 0 refills | Status: DC

## 2018-04-30 NOTE — Addendum Note (Signed)
Addended by: Alysia PennaNCHE, Nafisa Olds L on: 04/30/2018 01:13 PM   Modules accepted: Orders

## 2018-05-02 ENCOUNTER — Ambulatory Visit: Payer: 59 | Admitting: Physical Therapy

## 2018-05-03 ENCOUNTER — Ambulatory Visit: Payer: Self-pay | Admitting: Obstetrics & Gynecology

## 2018-05-03 DIAGNOSIS — Z0289 Encounter for other administrative examinations: Secondary | ICD-10-CM

## 2018-05-04 ENCOUNTER — Telehealth: Payer: Self-pay | Admitting: Nurse Practitioner

## 2018-05-04 DIAGNOSIS — J320 Chronic maxillary sinusitis: Secondary | ICD-10-CM | POA: Diagnosis not present

## 2018-05-04 DIAGNOSIS — J322 Chronic ethmoidal sinusitis: Secondary | ICD-10-CM | POA: Diagnosis not present

## 2018-05-04 DIAGNOSIS — H9313 Tinnitus, bilateral: Secondary | ICD-10-CM | POA: Diagnosis not present

## 2018-05-04 DIAGNOSIS — H8143 Vertigo of central origin, bilateral: Secondary | ICD-10-CM | POA: Diagnosis not present

## 2018-05-04 DIAGNOSIS — H8101 Meniere's disease, right ear: Secondary | ICD-10-CM | POA: Diagnosis not present

## 2018-05-04 DIAGNOSIS — J410 Simple chronic bronchitis: Secondary | ICD-10-CM | POA: Diagnosis not present

## 2018-05-04 DIAGNOSIS — R42 Dizziness and giddiness: Secondary | ICD-10-CM | POA: Diagnosis not present

## 2018-05-04 DIAGNOSIS — H903 Sensorineural hearing loss, bilateral: Secondary | ICD-10-CM | POA: Diagnosis not present

## 2018-05-04 MED FILL — CLINDAMYCIN HCL 150 MG CAPS: 150 | 10 days supply | Qty: 30 | Fill #0

## 2018-05-04 MED FILL — CHLORTHALIDONE 25 MG TABS: 25 | 60 days supply | Qty: 30 | Fill #0

## 2018-05-04 MED FILL — POTASSIUM CL ER 10 MEQ CAP: 10 | 30 days supply | Qty: 30 | Fill #0

## 2018-05-04 MED FILL — FLUCONAZOLE 100 MG TAB: 100 | 10 days supply | Qty: 3 | Fill #0

## 2018-05-04 NOTE — Telephone Encounter (Signed)
Pt request a letter from [place] to place her on a light duty or in the phone room. Pt stated she saw ENT today and they released her to go back to work on Monday, but the pt said her work place changed her schedule and will have to working with the doctor on the floor (on her feet all day). Pt stated she is uncomfortable/afriad to be her feet all day since she has been sick (out of work). Please advise.     Copied from CRM [id number]. Topic: Inquiry >> May 04, 2018  1:00 PM Windy KalataMichael, Taylor L, NT wrote: Reason for CRM: patient is requesting for Cindee Lameete to call her back in regards to a letter for work.

## 2018-05-04 NOTE — Telephone Encounter (Signed)
Pt is aware of the letter

## 2018-05-04 NOTE — Telephone Encounter (Signed)
Letter written

## 2018-05-08 ENCOUNTER — Encounter: Payer: Self-pay | Admitting: Nurse Practitioner

## 2018-05-08 ENCOUNTER — Ambulatory Visit: Payer: 59 | Admitting: Nurse Practitioner

## 2018-05-08 VITALS — BP 128/84 | HR 101 | Temp 98.5°F | Ht 66.0 in | Wt 208.0 lb

## 2018-05-08 DIAGNOSIS — J309 Allergic rhinitis, unspecified: Secondary | ICD-10-CM | POA: Diagnosis not present

## 2018-05-08 DIAGNOSIS — H9313 Tinnitus, bilateral: Secondary | ICD-10-CM

## 2018-05-08 DIAGNOSIS — H81399 Other peripheral vertigo, unspecified ear: Secondary | ICD-10-CM | POA: Diagnosis not present

## 2018-05-08 MED ORDER — FLUTICASONE PROPIONATE 50 MCG/ACT NA SUSP: 2.0000 | Freq: Every day | NASAL | 1 refills | Status: AC

## 2018-05-08 MED FILL — FLUTICASONE PROP 50 MCG SPR: 50 | 30 days supply | Qty: 16 | Fill #0

## 2018-05-08 NOTE — Progress Notes (Signed)
Subjective:  Patient ID: Katie Bishop, female    DOB: 20-Apr-1981  Age: 37 y.o. MRN: [id number]  CC: Follow-up (patient is follow up on dizzieness,still has some dizzieness, acide reflux is getting worse. ENT diagnoses consult. )  HPI   Accompanied by daughter.  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 Reports intermittent vertigo and nausea during work on Monday after 6hrs. Did not go to work today. She is hesitant about driving.   Reviewed past Medical, Social and Family history today.  Outpatient Medications Prior to Visit  Medication Sig Dispense Refill  . albuterol (PROVENTIL HFA;VENTOLIN HFA) 108 (90 Base) MCG/ACT inhaler Inhale 1-2 puffs into the lungs every 6 (six) hours as needed for wheezing or shortness of breath.    . Calcium Carb-Cholecalciferol (CALCIUM 600+D3) 600-800 MG-UNIT TABS Take 1 tablet by mouth daily.    . calcium carbonate (TUMS - DOSED IN MG ELEMENTAL CALCIUM) 500 MG chewable tablet Chew 2 tablets by mouth daily as needed for indigestion or heartburn.    . chlorthalidone (HYGROTON) 25 MG tablet   2  . clindamycin (CLEOCIN) 150 MG capsule   0  . fexofenadine (ALLEGRA ALLERGY) 180 MG tablet Take 1 tablet (180 mg total) by mouth daily. (Patient taking differently: Take 180 mg by mouth at bedtime. ) 90 tablet 3  . fluconazole (DIFLUCAN) 100 MG tablet   0  . ibuprofen (ADVIL,MOTRIN) 200 MG tablet Take 800 mg by mouth 2 (two) times daily as needed for headache or moderate pain.    Marland Kitchen levothyroxine (SYNTHROID, LEVOTHROID) 75 MCG tablet Take 1 tablet (75 mcg total) by mouth daily before breakfast. (Patient taking differently: Take 75 mcg by mouth at bedtime. ) 90 tablet 3  . LORazepam (ATIVAN) 0.5 MG tablet Take 0.25 mg by mouth daily as needed for anxiety.    .  montelukast (SINGULAIR) 10 MG tablet Take 1 tablet (10 mg total) by mouth daily. (Patient taking differently: Take 10 mg by mouth at bedtime. ) 90 tablet 1  . Nicotine 21-14-7 MG/24HR KIT Place 1 patch onto the skin daily. 56 each 0  . potassium chloride (MICRO-K) 10 MEQ CR capsule   2  . promethazine (PHENERGAN) 25 MG tablet Take 1 tablet (25 mg total) by mouth every 8 (eight) hours as needed for nausea or vomiting. 20 tablet 0  . ranitidine (ZANTAC) 150 MG tablet Take 1 tablet (150 mg total) by mouth at bedtime. 90 tablet 3  . fluticasone (FLONASE) 50 MCG/ACT nasal spray Place 2 sprays into both nostrils at bedtime.    . SUMAtriptan (IMITREX) 25 MG tablet Take 1-2 tablets (25-50 mg total) by mouth every 12 (twelve) hours as needed for migraine or headache. Do not take more that 4tabs in 24hrs 8 tablet 1  . predniSONE (STERAPRED UNI-PAK 21 TAB) 10 MG (21) TBPK tablet As directed on package (Patient not taking: Reported on 05/08/2018) 21 tablet 0   No facility-administered medications prior to visit.     ROS See HPI  Objective:  BP 128/84   Pulse (!) 101   Temp 98.5 F (36.9 C) (Oral)   Ht '5\' 6"'  (1.676 m)   Wt 208 lb (94.3 kg)   SpO2 98%   BMI 33.57 kg/m   BP Readings from Last 3 Encounters:  05/08/18 128/84  04/23/18 126/86  04/20/18 134/85  Wt Readings from Last 3 Encounters:  05/08/18 208 lb (94.3 kg)  04/23/18 208 lb (94.3 kg)  04/18/18 207 lb (93.9 kg)    Physical Exam  Constitutional: She is oriented to person, place, and time. She appears well-developed and well-nourished.  HENT:  Right Ear: External ear normal.  Left Ear: External ear normal.  Nose: Nose normal.  Mouth/Throat: Oropharynx is clear and moist.  Eyes: Pupils are equal, round, and reactive to light. Conjunctivae and EOM are normal.  Neck: Normal range of motion. Neck supple.  Cardiovascular: Normal rate.  Pulmonary/Chest: Effort normal.  Musculoskeletal: Normal range of motion.  Neurological: She  is alert and oriented to person, place, and time.  Psychiatric: She has a normal mood and affect. Her behavior is normal. Thought content normal.  Vitals reviewed.   Lab Results  Component Value Date   WBC 6.7 04/20/2018   HGB 13.5 04/20/2018   HCT 42.3 04/20/2018   PLT 420 (H) 04/20/2018   GLUCOSE 99 04/20/2018   CHOL 179 02/14/2018   TRIG 181.0 (H) 02/14/2018   HDL 40.10 02/14/2018   LDLCALC 103 (H) 02/14/2018   ALT 18 02/14/2018   AST 13 02/14/2018   NA 139 04/20/2018   K 3.8 04/20/2018   CL 105 04/20/2018   CREATININE 0.88 04/20/2018   BUN 5 (L) 04/20/2018   CO2 25 04/20/2018   TSH 4.29 02/14/2018    Ct Head Wo Contrast  Result Date: 04/23/2018 CLINICAL DATA:  Headache with dizziness EXAM: CT HEAD WITHOUT CONTRAST TECHNIQUE: Contiguous axial images were obtained from the base of the skull through the vertex without intravenous contrast. COMPARISON:  None. FINDINGS: Brain: The ventricles are normal in size and configuration. There is no intracranial mass, hemorrhage, extra-axial fluid collection, or midline shift. Gray-white compartments appear normal. No acute infarct evident. Vascular: No hyperdense vessel.  No vascular calcification evident. Skull: The bony calvarium appears intact. Sinuses/Orbits: Visualized paranasal sinuses are clear. Visualized orbits appear symmetric bilaterally. Other: Mastoid air cells are clear. IMPRESSION: Study within normal limits. Electronically Signed   By: Lowella Grip III M.D.   On: 04/23/2018 12:27    Assessment & Plan:   Teena was seen today for follow-up.  Diagnoses and all orders for this visit:  Other peripheral vertigo, unspecified ear  Tinnitus of both ears  Allergic rhinitis, unspecified seasonality, unspecified trigger -     fluticasone (FLONASE) 50 MCG/ACT nasal spray; Place 2 sprays into both nostrils at bedtime. -     Ambulatory referral to Allergy   I have discontinued Milliani Mikles's SUMAtriptan and predniSONE. I  am also having her maintain her fexofenadine, ranitidine, montelukast, LORazepam, ibuprofen, Calcium Carb-Cholecalciferol, calcium carbonate, albuterol, levothyroxine, Nicotine, promethazine, clindamycin, fluconazole, potassium chloride, chlorthalidone, and fluticasone.  Meds ordered this encounter  Medications  . fluticasone (FLONASE) 50 MCG/ACT nasal spray    Sig: Place 2 sprays into both nostrils at bedtime.    Dispense:  16 g    Refill:  1    Order Specific Question:   Supervising Provider    Answer:   Lucille Passy [[id number]]    Follow-up: Return if symptoms worsen or fail to improve.  Wilfred Lacy, NP

## 2018-05-08 NOTE — Patient Instructions (Addendum)
Have records from ENT faxed to me.  Consult with ENT if symptoms are persistent. Continue chlorthalidone and potassium as prescribed by ENT. Hold clindamycin due to GI side effects. Inform ENT that you have held oral antibiotics.  Inquire from benefits department about short term disability. If you do qualify, I will complete form but I will have to have records from ENT and PT first.  Advised to resume driving in presence of another person for next 2days. If no symptoms, then she whould be ok driving on her own.

## 2018-05-09 ENCOUNTER — Encounter: Payer: Self-pay | Admitting: Nurse Practitioner

## 2018-05-21 MED FILL — raNITIdine HCL 150 MG TABS: 150 | 90 days supply | Qty: 90 | Fill #0

## 2018-05-21 MED FILL — MONTELUKAST SOD 10 MG TAB: 10 | 90 days supply | Qty: 90 | Fill #0

## 2018-05-23 DIAGNOSIS — R42 Dizziness and giddiness: Secondary | ICD-10-CM | POA: Diagnosis not present

## 2018-05-23 DIAGNOSIS — H81399 Other peripheral vertigo, unspecified ear: Secondary | ICD-10-CM | POA: Diagnosis not present

## 2018-05-29 ENCOUNTER — Ambulatory Visit (INDEPENDENT_AMBULATORY_CARE_PROVIDER_SITE_OTHER): Payer: 59

## 2018-05-29 ENCOUNTER — Encounter: Payer: Self-pay | Admitting: Nurse Practitioner

## 2018-05-29 DIAGNOSIS — Z23 Encounter for immunization: Secondary | ICD-10-CM | POA: Diagnosis not present

## 2018-05-29 NOTE — Progress Notes (Signed)
After obtaining consent, and per orders of Dr. Dayton Martes, injection of Influenza vaccine 0.5mg /mL given in right deltoid by Kenyada Hy Jule Ser. Patient tolerated well and was instructed to remain in clinic for 20 minutes afterwards, and to report any adverse reaction to me immediately.

## 2018-06-13 ENCOUNTER — Ambulatory Visit: Payer: 59 | Admitting: Allergy and Immunology

## 2018-06-21 DIAGNOSIS — F411 Generalized anxiety disorder: Secondary | ICD-10-CM | POA: Diagnosis not present

## 2018-06-21 DIAGNOSIS — Z630 Problems in relationship with spouse or partner: Secondary | ICD-10-CM | POA: Diagnosis not present

## 2018-07-04 ENCOUNTER — Ambulatory Visit: Payer: 59 | Admitting: Allergy and Immunology

## 2018-07-04 ENCOUNTER — Encounter: Payer: Self-pay | Admitting: Allergy and Immunology

## 2018-07-04 VITALS — BP 118/78 | HR 88 | Temp 98.3°F | Resp 16 | Ht 67.0 in | Wt 208.0 lb

## 2018-07-04 DIAGNOSIS — J4520 Mild intermittent asthma, uncomplicated: Secondary | ICD-10-CM | POA: Diagnosis not present

## 2018-07-04 DIAGNOSIS — H1013 Acute atopic conjunctivitis, bilateral: Secondary | ICD-10-CM

## 2018-07-04 DIAGNOSIS — Z720 Tobacco use: Secondary | ICD-10-CM | POA: Diagnosis not present

## 2018-07-04 DIAGNOSIS — H1010 Acute atopic conjunctivitis, unspecified eye: Secondary | ICD-10-CM | POA: Insufficient documentation

## 2018-07-04 DIAGNOSIS — J3089 Other allergic rhinitis: Secondary | ICD-10-CM | POA: Diagnosis not present

## 2018-07-04 MED ORDER — MONTELUKAST SODIUM 10 MG PO TABS: 10.0000 mg | ORAL_TABLET | Freq: Every day | ORAL | 5 refills | Status: DC

## 2018-07-04 MED ORDER — AZELASTINE-FLUTICASONE 137-50 MCG/ACT NA SUSP: 1.0000 | Freq: Two times a day (BID) | NASAL | 5 refills | Status: DC | PRN

## 2018-07-04 MED ORDER — LEVOCETIRIZINE DIHYDROCHLORIDE 5 MG PO TABS: 5.0000 mg | ORAL_TABLET | Freq: Every evening | ORAL | 5 refills | Status: DC

## 2018-07-04 MED ORDER — OLOPATADINE HCL 0.2 % OP SOLN: 1.0000 [drp] | Freq: Every day | OPHTHALMIC | 5 refills | Status: DC | PRN

## 2018-07-04 MED FILL — OLOPATADINE HCL 0.2% EYE DR: 0.2 | 25 days supply | Qty: 3 | Fill #0

## 2018-07-04 MED FILL — LEVOCETIRIZINE 5 MG TABLET: 5 | 30 days supply | Qty: 30 | Fill #0

## 2018-07-04 NOTE — Assessment & Plan Note (Signed)
   Treatment plan as outlined above for allergic rhinitis.  A prescription has been provided for Pataday, one drop per eye daily as needed.  I have also recommended eye lubricant drops (i.e., Natural Tears) as needed. 

## 2018-07-04 NOTE — Assessment & Plan Note (Signed)
   Aeroallergen avoidance measures have been discussed and provided in written form.  A prescription has been provided for levocetirizine, 5 mg daily as needed.  A prescription has been provided for montelukast 10 mg daily at bedtime.  A prescription has been provided for Dymista (azelastine/fluticasone) nasal spray, 1 spray per nostril twice daily as needed. Proper nasal spray technique has been discussed and demonstrated.  Nasal saline lavage (NeilMed) has been recommended as needed and prior to medicated nasal sprays along with instructions for proper administration.  The risks and benefits of aeroallergen immunotherapy have been discussed. The patient is motivated to initiate immunotherapy if insurance coverage is favorable. She will let us know how she would like to proceed.

## 2018-07-04 NOTE — Progress Notes (Signed)
New Patient Note  RE: Morning Halberg MRN: [id number] DOB: [birth year] Date of Office Visit: 07/04/2018  Referring provider: Flossie Buffy, NP Primary care provider: Flossie Buffy, NP  Chief Complaint: Dizziness; Nasal Congestion; and Sinus Problem   History of present illness: Katie Bishop is a 37 y.o. female seen today in consultation requested by Flossie Buffy, NP.  She reports that she has experienced dizziness "on and off for years."  However, in the first week of September 2019 the dizziness was severe enough as to require evaluation and treatment in the emergency department.  She reports that she was seen by an otolaryngologist, Dr. Ernesto Rutherford, who diagnosed her with Mnire's disease but also suggested that she should see an allergist as allergic rhinosinusitis may be exacerbating the Mnire's disease.  Despite sinus surgery per Dr. Meredith Leeds in 2016, she states that she has "a ton of sinus issues", including sinus pressure over the forehead and cheekbones, as well as nasal congestion, thick postnasal drainage "all the time", rhinorrhea, sneezing, nasal pruritus, and ocular pruritus.  These symptoms occur year around but tend to be more frequent and severe during the springtime.  She takes fexofenadine, montelukast, and fluticasone nasal spray daily without adequate symptom relief.  She also experiences episodes of coughing, wheezing, and chest tightness.  The symptoms mainly occur during the springtime and are relieved temporarily with albuterol use.  Assessment and plan: Perennial and seasonal allergic rhinitis  Aeroallergen avoidance measures have been discussed and provided in written form.  A prescription has been provided for levocetirizine, 5 mg daily as needed.  A prescription has been provided for montelukast 10 mg daily at bedtime.  A prescription has been provided for Dymista (azelastine/fluticasone) nasal spray, 1 spray per nostril twice daily as needed.  Proper nasal spray technique has been discussed and demonstrated.  Nasal saline lavage (NeilMed) has been recommended as needed and prior to medicated nasal sprays along with instructions for proper administration.  The risks and benefits of aeroallergen immunotherapy have been discussed. The patient is motivated to initiate immunotherapy if insurance coverage is favorable. She will let us know how she would like to proceed.  Allergic conjunctivitis  Treatment plan as outlined above for allergic rhinitis.  A prescription has been provided for Pataday, one drop per eye daily as needed.  I have also recommended eye lubricant drops (i.e., Natural Tears) as needed.  Mild intermittent asthma  Tobacco cessation has been discussed and encouraged.  Montelukast has been prescribed (as above).  Continue albuterol HFA, 1 to 2 inhalations every 4-6 hours if needed.  Subjective and objective measures of pulmonary function will be followed and the treatment plan will be adjusted accordingly.   Meds ordered this encounter  Medications  . levocetirizine (XYZAL) 5 MG tablet    Sig: Take 1 tablet (5 mg total) by mouth every evening.    Dispense:  30 tablet    Refill:  5  . montelukast (SINGULAIR) 10 MG tablet    Sig: Take 1 tablet (10 mg total) by mouth at bedtime.    Dispense:  30 tablet    Refill:  5  . Azelastine-Fluticasone 137-50 MCG/ACT SUSP    Sig: Place 1 spray into both nostrils 2 (two) times daily as needed.    Dispense:  23 g    Refill:  5  . Olopatadine HCl 0.2 % SOLN    Sig: Place 1 drop into both eyes daily as needed.    Dispense:  2.5 mL  Refill:  5    Diagnostics: Spirometry: FVC was 3.26 L and FEV1 was 2.74 L (82% predicted) without significant postbronchodilator improvement.  This study was performed while the patient was asymptomatic.  Please see scanned spirometry results for details. Allergy skin testing: Positive to grass pollen, weed pollen, ragweed pollen, tree  pollen, molds, and dust mite antigen.    Physical examination: Blood pressure 118/78, pulse 88, temperature 98.3 F (36.8 C), temperature source Oral, resp. rate 16, height _0  (1.702 m), weight 208 lb (94.3 kg), SpO2 96 %.  General: Alert, interactive, in no acute distress. HEENT: TMs pearly gray, turbinates moderately edematous with thick discharge, post-pharynx moderately erythematous. Neck: Supple without lymphadenopathy. Lungs: Clear to auscultation without wheezing, rhonchi or rales. CV: Normal S1, S2 without murmurs. Abdomen: Nondistended, nontender. Skin: Warm and dry, without lesions or rashes. Extremities:  No clubbing, cyanosis or edema. Neuro:   Grossly intact.  Review of systems:  Review of systems negative except as noted in HPI / PMHx or noted below: Review of Systems  Constitutional: Negative.   HENT: Negative.   Eyes: Negative.   Respiratory: Negative.   Cardiovascular: Negative.   Gastrointestinal: Negative.   Genitourinary: Negative.   Musculoskeletal: Negative.   Skin: Negative.   Neurological: Negative.   Endo/Heme/Allergies: Negative.   Psychiatric/Behavioral: Negative.     Past medical history:  Past Medical History:  Diagnosis Date  . Allergy   . Anxiety   . Eczema   . GERD (gastroesophageal reflux disease)   . Hypothyroidism   . IBS (irritable bowel syndrome)   . Other abnormalities of heart beat    heart arrhythmai PACs PVCs  . PVC's (premature ventricular contractions)   . Seasonal allergies   . SVD (spontaneous vaginal delivery)    x 2 at [place]  . Thyroid disease     Past surgical history:  Past Surgical History:  Procedure Laterality Date  . CHOLECYSTECTOMY  2013   [facility]  . COLONOSCOPY    . DIAGNOSTIC LAPAROSCOPY     x 2 - diagnostic/cysts  . [place]ECTOMY Right [place]  . OTHER SURGICAL HISTORY  2016   sinus surgery in [place]  . TUBAL LIGATION Bilateral 2017  . UPPER GI ENDOSCOPY    . WISDOM TOOTH  EXTRACTION      Family history: Family History  Problem Relation Age of Onset  . Hyperlipidemia Mother   . Hypertension Mother   . Mitral valve prolapse Mother   . Hypothyroidism Mother   . Allergic rhinitis Mother   . Hyperlipidemia Father   . Allergic rhinitis Father   . Multiple myeloma Maternal Grandmother   . Anuerysm Paternal Grandmother        brain  . Cancer Paternal Grandfather        lung cancer  . Cancer Maternal Aunt        breast cancer  . Cancer Maternal Aunt        breast cancer  . Cancer Maternal Aunt        breast cancer  . Asthma Sister   . Allergic rhinitis Sister   . Asthma Daughter   . Allergic rhinitis Daughter   . Allergic rhinitis Daughter   . Eczema Neg Hx   . Urticaria Neg Hx   . Immunodeficiency Neg Hx   . Atopy Neg Hx   . Angioedema Neg Hx     Social history: Social History   Socioeconomic History  . Marital status: Married  Spouse name: Not on file  . Number of children: Not on file  . Years of education: Not on file  . Highest education level: Not on file  Occupational History  . Not on file  Social Needs  . Financial resource strain: Not on file  . Food insecurity:    Worry: Not on file    Inability: Not on file  . Transportation needs:    Medical: Not on file    Non-medical: Not on file  Tobacco Use  . Smoking status: Current Every Day Smoker    Packs/day: 0.50    Years: 18.00    Pack years: 9.00    Types: Cigarettes  . Smokeless tobacco: Never Used  Substance and Sexual Activity  . Alcohol use: Yes    Comment: social   . Drug use: Never  . Sexual activity: Yes    Birth control/protection: Surgical  Lifestyle  . Physical activity:    Days per week: Not on file    Minutes per session: Not on file  . Stress: Not on file  Relationships  . Social connections:    Talks on phone: Not on file    Gets together: Not on file    Attends religious service: Not on file    Active member of club or organization: Not on  file    Attends meetings of clubs or organizations: Not on file    Relationship status: Not on file  . Intimate partner violence:    Fear of current or ex partner: Not on file    Emotionally abused: Not on file    Physically abused: Not on file    Forced sexual activity: Not on file  Other Topics Concern  . Not on file  Social History Narrative  . Not on file   Environmental History: The patient lives in a townhouse with carpeting in the bedroom, gas heat, and central air.  There are 2 dogs in the home which have access to her bedroom.  She has smoked half pack of cigarettes per day on average for the past 17 years.  Allergies as of 07/04/2018      Reactions   Ciprofloxacin Anaphylaxis   Fluocinolone Swelling   Sulfamethoxazole-trimethoprim Anaphylaxis   Tetanus Toxoids Anaphylaxis   Excedrin Tension Headache [acetaminophen-caffeine] Palpitations      Medication List        Accurate as of 07/04/18  4:44 PM. Always use your most recent med list.          albuterol 108 (90 Base) MCG/ACT inhaler Commonly known as:  PROVENTIL HFA;VENTOLIN HFA Inhale 1-2 puffs into the lungs every 6 (six) hours as needed for wheezing or shortness of breath.   Azelastine-Fluticasone 137-50 MCG/ACT Susp Place 1 spray into both nostrils 2 (two) times daily as needed.   CALCIUM 600+D3 600-800 MG-UNIT Tabs Generic drug:  Calcium Carb-Cholecalciferol Take 1 tablet by mouth daily.   calcium carbonate 500 MG chewable tablet Commonly known as:  TUMS - dosed in mg elemental calcium Chew 2 tablets by mouth daily as needed for indigestion or heartburn.   fexofenadine 180 MG tablet Commonly known as:  ALLEGRA Take 1 tablet (180 mg total) by mouth daily.   fluticasone 50 MCG/ACT nasal spray Commonly known as:  FLONASE Place 2 sprays into both nostrils at bedtime.   ibuprofen 200 MG tablet Commonly known as:  ADVIL,MOTRIN Take 800 mg by mouth 2 (two) times daily as needed for headache or moderate  pain.  levocetirizine 5 MG tablet Commonly known as:  XYZAL Take 1 tablet (5 mg total) by mouth every evening.   levothyroxine 75 MCG tablet Commonly known as:  SYNTHROID, LEVOTHROID Take 1 tablet (75 mcg total) by mouth daily before breakfast.   LORazepam 0.5 MG tablet Commonly known as:  ATIVAN Take 0.25 mg by mouth daily as needed for anxiety.   montelukast 10 MG tablet Commonly known as:  SINGULAIR Take 1 tablet (10 mg total) by mouth daily.   montelukast 10 MG tablet Commonly known as:  SINGULAIR Take 1 tablet (10 mg total) by mouth at bedtime.   Nicotine 21-14-7 MG/24HR Kit Place 1 patch onto the skin daily.   Olopatadine HCl 0.2 % Soln Place 1 drop into both eyes daily as needed.   promethazine 25 MG tablet Commonly known as:  PHENERGAN Take 1 tablet (25 mg total) by mouth every 8 (eight) hours as needed for nausea or vomiting.   ranitidine 150 MG tablet Commonly known as:  ZANTAC Take 1 tablet (150 mg total) by mouth at bedtime.       Known medication allergies: Allergies  Allergen Reactions  . Ciprofloxacin Anaphylaxis  . Fluocinolone Swelling  . Sulfamethoxazole-Trimethoprim Anaphylaxis  . Tetanus Toxoids Anaphylaxis  . Excedrin Tension Headache [Acetaminophen-Caffeine] Palpitations    I appreciate the opportunity to take part in [facility] care. Please do not hesitate to contact me with questions.  Sincerely,   R. Edgar Frisk, MD

## 2018-07-04 NOTE — Assessment & Plan Note (Signed)
   Tobacco cessation has been discussed and encouraged.  Montelukast has been prescribed (as above).  Continue albuterol HFA, 1 to 2 inhalations every 4-6 hours if needed.  Subjective and objective measures of pulmonary function will be followed and the treatment plan will be adjusted accordingly.

## 2018-07-04 NOTE — Patient Instructions (Addendum)
Perennial and seasonal allergic rhinitis  Aeroallergen avoidance measures have been discussed and provided in written form.  A prescription has been provided for levocetirizine, 5 mg daily as needed.  A prescription has been provided for montelukast 10 mg daily at bedtime.  A prescription has been provided for Dymista (azelastine/fluticasone) nasal spray, 1 spray per nostril twice daily as needed. Proper nasal spray technique has been discussed and demonstrated.  Nasal saline lavage (NeilMed) has been recommended as needed and prior to medicated nasal sprays along with instructions for proper administration.  The risks and benefits of aeroallergen immunotherapy have been discussed. The patient is motivated to initiate immunotherapy if insurance coverage is favorable. She will let us know how she would like to proceed.  Allergic conjunctivitis  Treatment plan as outlined above for allergic rhinitis.  A prescription has been provided for Pataday, one drop per eye daily as needed.  I have also recommended eye lubricant drops (i.e., Natural Tears) as needed.  Mild intermittent asthma  Tobacco cessation has been discussed and encouraged.  Montelukast has been prescribed (as above).  Continue albuterol HFA, 1 to 2 inhalations every 4-6 hours if needed.  Subjective and objective measures of pulmonary function will be followed and the treatment plan will be adjusted accordingly.   Return in about 4 months (around 11/02/2018), or if symptoms worsen or fail to improve.  Control of House Dust Mite Allergen  House dust mites play a major role in allergic asthma and rhinitis.  They occur in environments with high humidity wherever human skin, the food for dust mites is found. High levels have been detected in dust obtained from mattresses, pillows, carpets, upholstered furniture, bed covers, clothes and soft toys.  The principal allergen of the house dust mite is found in its feces.  A gram  of dust may contain 1,000 mites and 250,000 fecal particles.  Mite antigen is easily measured in the air during house cleaning activities.    1. Encase mattresses, including the box spring, and pillow, in an air tight cover.  Seal the zipper end of the encased mattresses with wide adhesive tape. 2. Wash the bedding in water of 130 degrees Farenheit weekly.  Avoid cotton comforters/quilts and flannel bedding: the most ideal bed covering is the dacron comforter. 3. Remove all upholstered furniture from the bedroom. 4. Remove carpets, carpet padding, rugs, and non-washable window drapes from the bedroom.  Wash drapes weekly or use plastic window coverings. 5. Remove all non-washable stuffed toys from the bedroom.  Wash stuffed toys weekly. 6. Have the room cleaned frequently with a vacuum cleaner and a damp dust-mop.  The patient should not be in a room which is being cleaned and should wait 1 hour after cleaning before going into the room. 7. Close and seal all heating outlets in the bedroom.  Otherwise, the room will become filled with dust-laden air.  An electric heater can be used to heat the room. 8. Reduce indoor humidity to less than 50%.  Do not use a humidifier.  Control of Mold Allergen  Mold and fungi can grow on a variety of surfaces provided certain temperature and moisture conditions exist.  Outdoor molds grow on plants, decaying vegetation and soil.  The major outdoor mold, Alternaria and Cladosporium, are found in very high numbers during hot and dry conditions.  Generally, a late Summer - Fall peak is seen for common outdoor fungal spores.  Rain will temporarily lower outdoor mold spore count, but counts rise rapidly when  the rainy period ends.  The most important indoor molds are Aspergillus and Penicillium.  Dark, humid and poorly ventilated basements are ideal sites for mold growth.  The next most common sites of mold growth are the bathroom and the kitchen.  Outdoor [employer] 1. Use air conditioning and keep windows closed 2. Avoid exposure to decaying vegetation. 3. Avoid leaf raking. 4. Avoid grain handling. 5. Consider wearing a face mask if working in moldy areas.  Indoor Mold Control 1. Maintain humidity below 50%. 2. Clean washable surfaces with 5% bleach solution. 3. Remove sources e.g. Contaminated carpets.  Reducing Pollen Exposure  The American Academy of Allergy, Asthma and Immunology suggests the following steps to reduce your exposure to pollen during allergy seasons.    1. Do not hang sheets or clothing out to dry; pollen may collect on these items. 2. Do not mow lawns or spend time around freshly cut grass; mowing stirs up pollen. 3. Keep windows closed at night.  Keep car windows closed while driving. 4. Minimize morning activities outdoors, a time when pollen counts are usually at their highest. 5. Stay indoors as much as possible when pollen counts or humidity is high and on windy days when pollen tends to remain in the air longer. 6. Use air conditioning when possible.  Many air conditioners have filters that trap the pollen spores. 7. Use a HEPA room air filter to remove pollen form the indoor air you breathe.

## 2018-07-05 DIAGNOSIS — F411 Generalized anxiety disorder: Secondary | ICD-10-CM | POA: Diagnosis not present

## 2018-07-05 DIAGNOSIS — Z630 Problems in relationship with spouse or partner: Secondary | ICD-10-CM | POA: Diagnosis not present

## 2018-07-06 ENCOUNTER — Telehealth: Payer: Self-pay

## 2018-07-06 MED ORDER — FLUTICASONE PROPIONATE 50 MCG/ACT NA SUSP: 2.0000 | Freq: Two times a day (BID) | NASAL | 5 refills | Status: DC

## 2018-07-06 MED ORDER — ALBUTEROL SULFATE HFA 108 (90 BASE) MCG/ACT IN AERS: 1.0000 | INHALATION_SPRAY | Freq: Four times a day (QID) | RESPIRATORY_TRACT | 1 refills | Status: AC | PRN

## 2018-07-06 MED ORDER — AZELASTINE HCL 0.1 % NA SOLN: 2.0000 | Freq: Two times a day (BID) | NASAL | 5 refills | Status: DC

## 2018-07-06 MED FILL — FLUTICASONE PROP 50 MCG SPR: 50 | 30 days supply | Qty: 16 | Fill #0

## 2018-07-06 MED FILL — VENTOLIN HFA 90 MCG INHALER: 108 (90 BAS | 25 days supply | Qty: 18 | Fill #0

## 2018-07-06 MED FILL — AZELASTINE HCL 137 MCG/SPRA: 137 | 25 days supply | Qty: 30 | Fill #0

## 2018-07-06 NOTE — Telephone Encounter (Signed)
Ok so I talked with pharmacy and the nose spray is $30, if we separate it and Flonase and astelin it will be cheaper @ $5 a piece, can you please call them in as two separate prescriptions?  Also they did not have albuterol inhaler and you had mentioned refilling that one for me as well, please let me know if you can. Thank you!     Sent in rx for albuterol, and splinted the dymista into fluticasone and azelastine 1% left message for pt to call us back in reguard to me sending these in

## 2018-07-06 NOTE — Telephone Encounter (Signed)
Opened in error

## 2018-07-06 NOTE — Telephone Encounter (Signed)
Copied from CRM [id number]. Topic: General - Other >> Jul 05, 2018  5:03 PM Tamela OddiHarris, Brenda J wrote: Reason for CRM: Patient called to inform doctor that there is a recall for her medication, ranitidine (ZANTAC) 150 MG tablet.  Patient would like an alternative.  Please advise and CB at [phone].

## 2018-07-06 NOTE — Telephone Encounter (Signed)
Charlotte please advise,   Pharmacy confirm that it is on recall on certain brand but each week it the list will be update of which brand of ranitidine will be recall.

## 2018-07-06 NOTE — Telephone Encounter (Signed)
Checking with pharmacy

## 2018-07-09 NOTE — Telephone Encounter (Signed)
Left vm for the pt to call back, need to inform message below and which pharmacy to send in this new rx?

## 2018-07-09 NOTE — Telephone Encounter (Signed)
[facility]

## 2018-07-09 NOTE — Addendum Note (Signed)
Addended byLivingston Diones: Trendon Zaring on: 07/09/2018 11:09 AM   Modules accepted: Orders

## 2018-07-10 MED ORDER — FAMOTIDINE 20 MG PO TABS: 20.0000 mg | ORAL_TABLET | Freq: Every day | ORAL | 3 refills | Status: AC

## 2018-07-10 NOTE — Addendum Note (Signed)
Addended byLivingston Diones: Heath Tesler on: 07/10/2018 09:36 AM   Modules accepted: Orders

## 2018-07-19 DIAGNOSIS — Z630 Problems in relationship with spouse or partner: Secondary | ICD-10-CM | POA: Diagnosis not present

## 2018-07-19 DIAGNOSIS — F411 Generalized anxiety disorder: Secondary | ICD-10-CM | POA: Diagnosis not present

## 2018-08-02 MED FILL — LEVOTHYROXINE 75 MCG TABLET: 75 | 90 days supply | Qty: 90 | Fill #0

## 2018-08-02 MED FILL — LEVOCETIRIZINE 5 MG TABLET: 5 | 30 days supply | Qty: 30 | Fill #1

## 2018-08-17 ENCOUNTER — Encounter: Payer: Self-pay | Admitting: Nurse Practitioner

## 2018-08-17 DIAGNOSIS — Z1239 Encounter for other screening for malignant neoplasm of breast: Secondary | ICD-10-CM

## 2018-08-22 ENCOUNTER — Telehealth: Payer: Self-pay

## 2018-08-22 ENCOUNTER — Encounter: Payer: Self-pay | Admitting: Nurse Practitioner

## 2018-08-22 NOTE — Telephone Encounter (Signed)
Abstracted

## 2018-08-23 ENCOUNTER — Encounter: Payer: Self-pay | Admitting: Nurse Practitioner

## 2018-08-23 DIAGNOSIS — F411 Generalized anxiety disorder: Secondary | ICD-10-CM | POA: Diagnosis not present

## 2018-08-23 DIAGNOSIS — Z630 Problems in relationship with spouse or partner: Secondary | ICD-10-CM | POA: Diagnosis not present

## 2018-08-24 MED FILL — MONTELUKAST SOD 10 MG TAB: 10 | 30 days supply | Qty: 30 | Fill #0

## 2018-08-27 ENCOUNTER — Ambulatory Visit (HOSPITAL_BASED_OUTPATIENT_CLINIC_OR_DEPARTMENT_OTHER): Payer: 59

## 2018-09-17 ENCOUNTER — Ambulatory Visit (HOSPITAL_BASED_OUTPATIENT_CLINIC_OR_DEPARTMENT_OTHER): Payer: 59

## 2018-09-17 MED FILL — LEVOCETIRIZINE 5 MG TABLET: 5 | 30 days supply | Qty: 30 | Fill #2

## 2018-09-17 MED FILL — MONTELUKAST SOD 10 MG TAB: 10 | 30 days supply | Qty: 30 | Fill #1

## 2018-09-17 MED FILL — FLUTICASONE PROP 50 MCG SPR: 50 | 30 days supply | Qty: 16 | Fill #1 | Status: TO

## 2018-09-25 ENCOUNTER — Ambulatory Visit (HOSPITAL_BASED_OUTPATIENT_CLINIC_OR_DEPARTMENT_OTHER)
Admission: RE | Admit: 2018-09-25 | Discharge: 2018-09-25 | Disposition: A | Discharge status: Home / Self Care | Payer: 59 | Source: Ambulatory Visit | Attending: Nurse Practitioner | Admitting: Nurse Practitioner

## 2018-09-25 ENCOUNTER — Encounter (HOSPITAL_BASED_OUTPATIENT_CLINIC_OR_DEPARTMENT_OTHER): Payer: Self-pay

## 2018-09-25 DIAGNOSIS — Z1239 Encounter for other screening for malignant neoplasm of breast: Secondary | ICD-10-CM

## 2018-09-25 DIAGNOSIS — Z1231 Encounter for screening mammogram for malignant neoplasm of breast: Secondary | ICD-10-CM | POA: Diagnosis not present

## 2018-09-25 MED FILL — OLOPATADINE HCL 0.2% EYE DR: 0.2 | 25 days supply | Qty: 3 | Fill #1 | Status: TO

## 2018-10-12 ENCOUNTER — Ambulatory Visit: Payer: 59 | Admitting: Nurse Practitioner

## 2018-10-12 ENCOUNTER — Encounter: Payer: Self-pay | Admitting: Nurse Practitioner

## 2018-10-12 VITALS — BP 120/78 | HR 93 | Temp 98.3°F | Ht 67.0 in | Wt 212.4 lb

## 2018-10-12 DIAGNOSIS — E039 Hypothyroidism, unspecified: Secondary | ICD-10-CM | POA: Diagnosis not present

## 2018-10-12 DIAGNOSIS — I491 Atrial premature depolarization: Secondary | ICD-10-CM | POA: Insufficient documentation

## 2018-10-12 DIAGNOSIS — R42 Dizziness and giddiness: Secondary | ICD-10-CM | POA: Diagnosis not present

## 2018-10-12 DIAGNOSIS — H9201 Otalgia, right ear: Secondary | ICD-10-CM | POA: Diagnosis not present

## 2018-10-12 LAB — TSH: TSH: 4.39 u[IU]/mL (ref 0.35–4.50)

## 2018-10-12 LAB — T4, FREE: FREE T4: 0.87 ng/dL (ref 0.60–1.60)

## 2018-10-12 NOTE — Progress Notes (Signed)
Subjective:  Patient ID: Katie Bishop, female    DOB: [birth year]  Age: 38 y.o. MRN: [id number]  CC: Ear Pain (pt is c/o of right ear painful and dizzy/started 4 days ago/ )  Otalgia   There is pain in the right ear. This is a recurrent problem. The current episode started in the past 7 days. The problem occurs every few hours. The problem has been waxing and waning. There has been no fever. Pertinent negatives include no coughing, ear discharge, headaches, hearing loss, neck pain, rash, rhinorrhea or sore throat. She has tried nothing for the symptoms. There is no history of a chronic ear infection, hearing loss or a tympanostomy tube.  hx of meniere's dx, chlorthalidone and promethazine prescribed by ENT  Reviewed past Medical, Social and Family history today.  Outpatient Medications Prior to Visit  Medication Sig Dispense Refill  . albuterol (PROVENTIL HFA;VENTOLIN HFA) 108 (90 Base) MCG/ACT inhaler Inhale 1-2 puffs into the lungs every 6 (six) hours as needed for wheezing or shortness of breath. 18 g 1  . azelastine (ASTELIN) 0.1 % nasal spray Place 2 sprays into both nostrils 2 (two) times daily. Use in each nostril as directed 30 mL 5  . Calcium Carb-Cholecalciferol (CALCIUM 600+D3) 600-800 MG-UNIT TABS Take 1 tablet by mouth daily.    . calcium carbonate (TUMS - DOSED IN MG ELEMENTAL CALCIUM) 500 MG chewable tablet Chew 2 tablets by mouth daily as needed for indigestion or heartburn.    . famotidine (PEPCID) 20 MG tablet Take 1 tablet (20 mg total) by mouth at bedtime. 90 tablet 3  . fluticasone (FLONASE) 50 MCG/ACT nasal spray Place 2 sprays into both nostrils at bedtime. 16 g 1  . ibuprofen (ADVIL,MOTRIN) 200 MG tablet Take 800 mg by mouth 2 (two) times daily as needed for headache or moderate pain.    Marland Kitchen LORazepam (ATIVAN) 0.5 MG tablet Take 0.25 mg by mouth daily as needed for anxiety.    . montelukast (SINGULAIR) 10 MG tablet Take 1 tablet (10 mg total) by mouth at bedtime. 30  tablet 5  . Olopatadine HCl 0.2 % SOLN Place 1 drop into both eyes daily as needed. 2.5 mL 5  . promethazine (PHENERGAN) 25 MG tablet Take 1 tablet (25 mg total) by mouth every 8 (eight) hours as needed for nausea or vomiting. 20 tablet 0  . levothyroxine (SYNTHROID, LEVOTHROID) 75 MCG tablet Take 1 tablet (75 mcg total) by mouth daily before breakfast. (Patient taking differently: Take 75 mcg by mouth at bedtime. ) 90 tablet 3  . levocetirizine (XYZAL) 5 MG tablet Take 1 tablet (5 mg total) by mouth every evening. 30 tablet 5  . Azelastine-Fluticasone 137-50 MCG/ACT SUSP Place 1 spray into both nostrils 2 (two) times daily as needed. (Patient not taking: Reported on 10/12/2018) 23 g 5  . fexofenadine (ALLEGRA ALLERGY) 180 MG tablet Take 1 tablet (180 mg total) by mouth daily. (Patient not taking: Reported on 10/12/2018) 90 tablet 3  . fluticasone (FLONASE) 50 MCG/ACT nasal spray Place 2 sprays into both nostrils 2 (two) times daily. (Patient not taking: Reported on 10/12/2018) 18.2 g 5  . montelukast (SINGULAIR) 10 MG tablet Take 1 tablet (10 mg total) by mouth daily. (Patient not taking: Reported on 10/12/2018) 90 tablet 1  . Nicotine 21-14-7 MG/24HR KIT Place 1 patch onto the skin daily. (Patient not taking: Reported on 07/04/2018) 56 each 0  . ranitidine (ZANTAC) 150 MG tablet Take 1 tablet (150 mg total) by mouth  at bedtime. (Patient not taking: Reported on 07/04/2018) 90 tablet 3   No facility-administered medications prior to visit.     ROS See HPI  Objective:  BP 120/78   Pulse 93   Temp 98.3 F (36.8 C) (Oral)   Ht '5\' 7"'  (1.702 m)   Wt 212 lb 6.4 oz (96.3 kg)   SpO2 97%   BMI 33.27 kg/m   BP Readings from Last 3 Encounters:  10/12/18 120/78  07/04/18 118/78  05/08/18 128/84    Wt Readings from Last 3 Encounters:  10/12/18 212 lb 6.4 oz (96.3 kg)  07/04/18 208 lb (94.3 kg)  05/08/18 208 lb (94.3 kg)    Physical Exam Vitals signs reviewed.  HENT:     Right Ear: Tympanic  membrane, ear canal and external ear normal.     Left Ear: Tympanic membrane, ear canal and external ear normal.     Mouth/Throat:     Mouth: Mucous membranes are moist.  Eyes:     Extraocular Movements: Extraocular movements intact.     Conjunctiva/sclera: Conjunctivae normal.     Pupils: Pupils are equal, round, and reactive to light.  Neck:     Musculoskeletal: Normal range of motion and neck supple.  Cardiovascular:     Rate and Rhythm: Normal rate.  Pulmonary:     Effort: Pulmonary effort is normal.  Lymphadenopathy:     Cervical: No cervical adenopathy.     Right cervical: No superficial, deep or posterior cervical adenopathy.    Left cervical: No superficial, deep or posterior cervical adenopathy.  Neurological:     Mental Status: She is alert and oriented to person, place, and time.  Psychiatric:        Mood and Affect: Mood normal.        Behavior: Behavior normal.     Lab Results  Component Value Date   WBC 6.7 04/20/2018   HGB 13.5 04/20/2018   HCT 42.3 04/20/2018   PLT 420 (H) 04/20/2018   GLUCOSE 99 04/20/2018   CHOL 179 02/14/2018   TRIG 181.0 (H) 02/14/2018   HDL 40.10 02/14/2018   LDLCALC 103 (H) 02/14/2018   ALT 18 02/14/2018   AST 13 02/14/2018   NA 139 04/20/2018   K 3.8 04/20/2018   CL 105 04/20/2018   CREATININE 0.88 04/20/2018   BUN 5 (L) 04/20/2018   CO2 25 04/20/2018   TSH 4.39 10/12/2018    Mm 3d Screen Breast Bilateral  Result Date: 09/25/2018 CLINICAL DATA:  Screening. EXAM: DIGITAL SCREENING BILATERAL MAMMOGRAM WITH TOMO AND CAD COMPARISON:  None. ACR Breast Density Category c: The breast tissue is heterogeneously dense, which may obscure small masses FINDINGS: There are no findings suspicious for malignancy. Images were processed with CAD. IMPRESSION: No mammographic evidence of malignancy. A result letter of this screening mammogram will be mailed directly to the patient. RECOMMENDATION: Screening mammogram at age 72. (Code:SM-B-40A)  BI-RADS CATEGORY  1: Negative. Electronically Signed   By: Lajean Manes M.D.   On: 09/25/2018 15:50    Assessment & Plan:   Loxley was seen today for ear pain.  Diagnoses and all orders for this visit:  Right ear pain  Hypothyroidism, unspecified type -     TSH -     T4, free -     levothyroxine (SYNTHROID, LEVOTHROID) 75 MCG tablet; Take 1 tablet (75 mcg total) by mouth at bedtime.  Vertigo   I have discontinued Mychele Newhall's fexofenadine, ranitidine, Nicotine, and Azelastine-Fluticasone. I have  also changed her levothyroxine. Additionally, I am having her maintain her LORazepam, ibuprofen, Calcium Carb-Cholecalciferol, calcium carbonate, promethazine, fluticasone, levocetirizine, montelukast, Olopatadine HCl, azelastine, albuterol, and famotidine.  Meds ordered this encounter  Medications  . levothyroxine (SYNTHROID, LEVOTHROID) 75 MCG tablet    Sig: Take 1 tablet (75 mcg total) by mouth at bedtime.    Dispense:  90 tablet    Refill:  1    Order Specific Question:   Supervising Provider    Answer:   Lucille Passy [[id number]]    Problem List Items Addressed This Visit      Endocrine   Hypothyroidism   Relevant Medications   levothyroxine (SYNTHROID, LEVOTHROID) 75 MCG tablet   Other Relevant Orders   TSH (Completed)   T4, free (Completed)     Other   Vertigo    Other Visit Diagnoses    Right ear pain    -  Primary       Follow-up: No follow-ups on file.  Wilfred Lacy, NP

## 2018-10-12 NOTE — Patient Instructions (Addendum)
Please take chlorthalidone and promethazine as prescribed by ENT.  Continue vertigo exercise as instructed by PT.  Maintain adequate oral hydration.  Make appt with ENT if no improvement in 2-3days.  Go to lab for blood draw  Earache, Adult An earache, or ear pain, can be caused by many things, including:  An infection.  Ear wax buildup.  Ear pressure.  Something in the ear that should not be there (foreign body).  A sore throat.  Tooth problems.  Jaw problems. Treatment of the earache will depend on the cause. If the cause is not clear or cannot be determined, you may need to watch your symptoms until your earache goes away or until a cause is found. Follow these instructions at home: Pay attention to any changes in your symptoms. Take these actions to help with your pain:  Take or apply over-the-counter and prescription medicines only as told by your health care provider.  If you were prescribed an antibiotic medicine, use it as told by your health care provider. Do not stop using the antibiotic even if you start to feel better.  Do not put anything in your ear other than medicine that is prescribed by your health care provider.  If directed, apply heat to the affected area as often as told by your health care provider. Use the heat source that your health care provider recommends, such as a moist heat pack or a heating pad. ? Place a towel between your skin and the heat source. ? Leave the heat on for 20-30 minutes. ? Remove the heat if your skin turns bright red. This is especially important if you are unable to feel pain, heat, or cold. You may have a greater risk of getting burned.  If directed, put ice on the ear: ? Put ice in a plastic bag. ? Place a towel between your skin and the bag. ? Leave the ice on for 20 minutes, 2-3 times a day.  Try resting in an upright position instead of lying down. This may help to reduce pressure in your ear and relieve  pain.  Chew gum if it helps to relieve your ear pain.  Treat any allergies as told by your health care provider.  Keep all follow-up visits as told by your health care provider. This is important. Contact a health care provider if:  Your pain does not improve within 2 days.  Your earache gets worse.  You have new symptoms.  You have a fever. Get help right away if:  You have a severe headache.  You have a stiff neck.  You have trouble swallowing.  You have redness or swelling behind your ear.  You have fluid or blood coming from your ear.  You have hearing loss.  You feel dizzy. This information is not intended to replace advice given to you by your health care provider. Make sure you discuss any questions you have with your health care provider. Document Released: 03/18/2004 Document Revised: 03/29/2016 Document Reviewed: 01/25/2016 Elsevier Interactive Patient Education  [employer].

## 2018-10-15 ENCOUNTER — Encounter: Payer: Self-pay | Admitting: Nurse Practitioner

## 2018-10-15 MED ORDER — LEVOTHYROXINE SODIUM 75 MCG PO TABS: 75.0000 ug | ORAL_TABLET | Freq: Every day | ORAL | 1 refills | Status: DC

## 2018-10-29 MED FILL — MONTELUKAST SOD 10 MG TAB: 10 | 30 days supply | Qty: 30 | Fill #2 | Status: TO

## 2018-10-29 MED FILL — VENTOLIN HFA 90 MCG INHALER: 108 (90 BAS | 25 days supply | Qty: 18 | Fill #1

## 2018-11-06 ENCOUNTER — Encounter: Payer: Self-pay | Admitting: Nurse Practitioner

## 2018-11-07 ENCOUNTER — Ambulatory Visit: Payer: 59 | Admitting: Allergy and Immunology

## 2018-11-07 MED FILL — LEVOTHYROXINE 75 MCG TABLET: 75 | 90 days supply | Qty: 90 | Fill #0

## 2018-11-15 ENCOUNTER — Telehealth: Payer: 59 | Admitting: Physician Assistant

## 2018-11-15 DIAGNOSIS — J208 Acute bronchitis due to other specified organisms: Secondary | ICD-10-CM

## 2018-11-15 MED ORDER — BENZONATATE 100 MG PO CAPS: 100.0000 mg | ORAL_CAPSULE | Freq: Three times a day (TID) | ORAL | 0 refills | Status: DC | PRN

## 2018-11-15 NOTE — Progress Notes (Signed)
We are sorry that you are not feeling well.  Here is how we plan to help!  Based on your presentation I believe you most likely have A cough due to a virus.  This is called viral bronchitis and is best treated by rest, plenty of fluids and control of the cough.  You may use Ibuprofen or Tylenol as directed to help your symptoms.     In addition you may use A prescription cough medication called Tessalon Perles 100mg . You may take 1-2 capsules every 8 hours as needed for your cough.  I do not feel you have coronavirus based on what you have told me, especially with your husband feeling fine. That being said we are recommending anyone with upper respiratory symptoms (regardless of cause) stay at home until symptom free. I would recommend that you stay home the rest of the week and let us or your PCP know if any new symptoms develop.  From your responses in the eVisit questionnaire you describe inflammation in the upper respiratory tract which is causing a significant cough.  This is commonly called Bronchitis and has four common causes:    Allergies  Viral Infections  Acid Reflux  Bacterial Infection Allergies, viruses and acid reflux are treated by controlling symptoms or eliminating the cause. An example might be a cough caused by taking certain blood pressure medications. You stop the cough by changing the medication. Another example might be a cough caused by acid reflux. Controlling the reflux helps control the cough.  USE OF BRONCHODILATOR ("RESCUE") INHALERS: There is a risk from using your bronchodilator too frequently.  The risk is that over-reliance on a medication which only relaxes the muscles surrounding the breathing tubes can reduce the effectiveness of medications prescribed to reduce swelling and congestion of the tubes themselves.  Although you feel brief relief from the bronchodilator inhaler, your asthma may actually be worsening with the tubes becoming more swollen and filled  with mucus.  This can delay other crucial treatments, such as oral steroid medications. If you need to use a bronchodilator inhaler daily, several times per day, you should discuss this with your provider.  There are probably better treatments that could be used to keep your asthma under control.     HOME CARE . Only take medications as instructed by your medical team. . Complete the entire course of an antibiotic. . Drink plenty of fluids and get plenty of rest. . Avoid close contacts especially the very young and the elderly . Cover your mouth if you cough or cough into your sleeve. . Always remember to wash your hands . A steam or ultrasonic humidifier can help congestion.   GET HELP RIGHT AWAY IF: . You develop worsening fever. . You become short of breath . You cough up blood. . Your symptoms persist after you have completed your treatment plan MAKE SURE YOU   Understand these instructions.  Will watch your condition.  Will get help right away if you are not doing well or get worse.  Your e-visit answers were reviewed by a board certified advanced clinical practitioner to complete your personal care plan.  Depending on the condition, your plan could have included both over the counter or prescription medications. If there is a problem please reply  once you have received a response from your provider. Your safety is important to [place].  If you have drug allergies check your prescription carefully.    You can use MyChart to ask questions about  today's visit, request a non-urgent call back, or ask for a work or school excuse for 24 hours related to this e-Visit. If it has been greater than 24 hours you will need to follow up with your provider, or enter a new e-Visit to address those concerns. You will get an e-mail in the next two days asking about your experience.  I hope that your e-visit has been valuable and will speed your recovery. Thank you for using e-visits.

## 2018-11-15 NOTE — Progress Notes (Signed)
I have spent 5 minutes in review of e-visit questionnaire, review and updating patient chart, medical decision making and response to patient.   Havish Petties Cody Maragret Vanacker, PA-C    

## 2018-11-28 MED FILL — AZELASTINE HCL 137 MCG SPRY: 0.1 | 25 days supply | Qty: 30 | Fill #0

## 2018-11-28 MED FILL — OLOPATADINE HCL 0.2% EYE DR: 0.2 | 25 days supply | Qty: 3 | Fill #0

## 2018-11-28 MED FILL — FLUTICASONE PROP 50 MCG SPR: 50 | 30 days supply | Qty: 16 | Fill #0

## 2018-11-28 MED FILL — MONTELUKAST SOD 10 MG TAB: 10 | 30 days supply | Qty: 30 | Fill #0

## 2018-12-10 ENCOUNTER — Ambulatory Visit (INDEPENDENT_AMBULATORY_CARE_PROVIDER_SITE_OTHER): Payer: 59 | Admitting: Nurse Practitioner

## 2018-12-10 ENCOUNTER — Encounter: Payer: Self-pay | Admitting: Nurse Practitioner

## 2018-12-10 VITALS — BP 130/80 | Temp 98.5°F | Ht 67.0 in | Wt 207.0 lb

## 2018-12-10 DIAGNOSIS — F411 Generalized anxiety disorder: Secondary | ICD-10-CM | POA: Diagnosis not present

## 2018-12-10 DIAGNOSIS — F321 Major depressive disorder, single episode, moderate: Secondary | ICD-10-CM | POA: Diagnosis not present

## 2018-12-10 MED ORDER — BUPROPION HCL ER (XL) 150 MG PO TB24: 150.0000 mg | ORAL_TABLET | Freq: Every day | ORAL | 1 refills | Status: DC

## 2018-12-10 MED ORDER — LORAZEPAM 0.5 MG PO TABS: 0.2500 mg | ORAL_TABLET | Freq: Two times a day (BID) | ORAL | 0 refills | Status: DC | PRN

## 2018-12-10 NOTE — Progress Notes (Signed)
Virtual Visit via Video Note  I connected with Katie Bishop on 12/10/18 at  2:30 PM EDT by a video enabled telemedicine application and verified that I am speaking with the correct person using two identifiers.   I discussed the limitations of evaluation and management by telemedicine and the availability of in person appointments. The patient expressed understanding and agreed to proceed.  Provider: [facility] Patient: Home.  CC: anxiety and depression.  History of Present Illness:  Anxiety and depression: Chronic per patient, worseing in last 45month. Triggered by husband's infidelity, current pandemic which has led to decreased work hours. reporst she is contemplating sepeartion from husband but unable to do that now due to financial strain. Reports she feels trapped in her marriage. Medications used in past: lexapro (anhedonia), prozac, zoloft (headache) PMP reviewed: lorazepam 0.5 last filled 2018, #30tabs Depression screen [facility] 2/9 12/10/2018 02/14/2018  Decreased Interest 0 0  Down, Depressed, Hopeless 1 0  PHQ - 2 Score 1 0  Altered sleeping 3 -  Tired, decreased energy 2 -  Change in appetite 0 -  Feeling bad or failure about yourself  1 -  Trouble concentrating 3 -  Moving slowly or fidgety/restless 0 -  Suicidal thoughts 0 -  PHQ-9 Score 10 -   GAD 7 : Generalized Anxiety Score 12/10/2018  Nervous, Anxious, on Edge 3  Control/stop worrying 3  Worry too much - different things 3  Trouble relaxing 3  Restless 1  Easily annoyed or irritable 2  Afraid - awful might happen 1  Total GAD 7 Score 16  Anxiety Difficulty Somewhat difficult   Observations/Objective: Physical Exam  Constitutional: No distress.  Pulmonary/Chest: Effort normal.  Neurological: She is alert.  Psychiatric: She has a normal mood and affect. Her behavior is normal. Thought content normal.  Vitals reviewed.  Assessment and Plan: Breonna was seen today for anxiety.  Diagnoses and all orders for this  visit:  GAD (generalized anxiety disorder) -     buPROPion (WELLBUTRIN XL) 150 MG 24 hr tablet; Take 1 tablet (150 mg total) by mouth daily. -     LORazepam (ATIVAN) 0.5 MG tablet; Take 0.5-1 tablets (0.25-0.5 mg total) by mouth every 12 (twelve) hours as needed for anxiety.  Depression, major, single episode, moderate (HCC) -     buPROPion (WELLBUTRIN XL) 150 MG 24 hr tablet; Take 1 tablet (150 mg total) by mouth daily. -     LORazepam (ATIVAN) 0.5 MG tablet; Take 0.5-1 tablets (0.25-0.5 mg total) by mouth every 12 (twelve) hours as needed for anxiety.   Follow Up Instructions: F/up in 35month. Start wellbutrin Use lorazepam as needed Schedule appt with psychologist ASAP May also use [facility] app or call [facility] helpline.   I discussed the assessment and treatment plan with the patient. The patient was provided an opportunity to ask questions and all were answered. The patient agreed with the plan and demonstrated an understanding of the instructions.   The patient was advised to call back or seek an in-person evaluation if the symptoms worsen or if the condition fails to improve as anticipated.   Alysia Penna, NP

## 2018-12-10 NOTE — Assessment & Plan Note (Signed)
Ongoing for years, worseing in last 34month. Triggered by husband's infidelity, financial instability: current pandemic which has led to decreased work hours. She has gone for marriage counseling but now contemplating seperation from husband but unable to do that now due to financial strain. Reports feeling trapped in her marriage. Medications used in past: lexapro (anhedonia), prozac, zoloft (headache) PMP reviewed: lorazepam 0.5 last filled 2018, #30tabs  F/up in 34month. Start wellbutrin Use lorazepam as needed Schedule appt with psychologist ASAP (she will like to schedule her appt) May also use Headspace app or call [facility] helpline.

## 2018-12-19 ENCOUNTER — Ambulatory Visit: Payer: 59 | Admitting: Allergy and Immunology

## 2018-12-24 MED FILL — FLUTICASONE PROP 50 MCG SPR: 50 | 30 days supply | Qty: 16 | Fill #1

## 2018-12-28 MED FILL — MONTELUKAST SOD 10 MG TAB: 10 | 30 days supply | Qty: 30 | Fill #1

## 2019-01-08 ENCOUNTER — Encounter: Payer: Self-pay | Admitting: Nurse Practitioner

## 2019-01-08 ENCOUNTER — Ambulatory Visit (INDEPENDENT_AMBULATORY_CARE_PROVIDER_SITE_OTHER): Payer: 59 | Admitting: Nurse Practitioner

## 2019-01-08 VITALS — BP 118/78 | HR 86 | Temp 98.5°F | Ht 67.0 in | Wt 208.0 lb

## 2019-01-08 DIAGNOSIS — F321 Major depressive disorder, single episode, moderate: Secondary | ICD-10-CM

## 2019-01-08 DIAGNOSIS — F411 Generalized anxiety disorder: Secondary | ICD-10-CM

## 2019-01-08 NOTE — Assessment & Plan Note (Signed)
Mood improved during first 3weeks on wellbutrin, then worsened in last 1week. She then discontinued Wellbutrin for that last 3days which led to improved symptoms (fatigue). She does not wish to take another medication at this time. She has not schedule appt with counselor, but plans to do that this week 

## 2019-01-08 NOTE — Progress Notes (Signed)
Virtual Visit via Video Note  I connected with Cindi Carbon on 01/08/19 at 10:00 AM EDT by a video enabled telemedicine application and verified that I am speaking with the correct person using two identifiers.  Location: Patient:Home Provider: Office   I discussed the limitations of evaluation and management by telemedicine and the availability of in person appointments. The patient expressed understanding and agreed to proceed.  History of Present Illness: Anxiety and Depression: Mood improved during first 3weeks on wellbutrin, then worsened in last 1week. She then discontinued Wellbutrin for that last 3days which led to improved symptoms (fatigue). She does not wish to take another medication at this time. She has not schedule appt with counselor, but plans to do that this week  Reviewed medication and problem list with patient.  Observations/Objective: Physical Exam  Constitutional: She is oriented to person, place, and time. No distress.  Pulmonary/Chest: Effort normal.  Neurological: She is alert and oriented to person, place, and time.  Psychiatric: She has a normal mood and affect. Her behavior is normal. Thought content normal.  Vitals reviewed.   Assessment and Plan: Nandy was seen today for follow-up.  Diagnoses and all orders for this visit:  Depression, major, single episode, moderate (HCC)  GAD (generalized anxiety disorder)   Follow Up Instructions: F/up as needed for anxiety and depression. Schedule counseling sessions as soon as possible Call office and schedule CPE (03/2019)   I discussed the assessment and treatment plan with the patient. The patient was provided an opportunity to ask questions and all were answered. The patient agreed with the plan and demonstrated an understanding of the instructions.   The patient was advised to call back or seek an in-person evaluation if the symptoms worsen or if the condition fails to improve as  anticipated.   Alysia Penna, NP

## 2019-01-08 NOTE — Patient Instructions (Signed)
F/up as needed for anxiety and depression. Schedule counseling sessions as soon as possible Call office and schedule CPE (03/2019)

## 2019-01-08 NOTE — Assessment & Plan Note (Signed)
Mood improved during first 3weeks on wellbutrin, then worsened in last 1week. She then discontinued Wellbutrin for that last 3days which led to improved symptoms (fatigue). She does not wish to take another medication at this time. She has not schedule appt with counselor, but plans to do that this week

## 2019-01-09 ENCOUNTER — Ambulatory Visit: Payer: 59 | Admitting: Nurse Practitioner

## 2019-01-15 DIAGNOSIS — F4310 Post-traumatic stress disorder, unspecified: Secondary | ICD-10-CM | POA: Diagnosis not present

## 2019-02-05 DIAGNOSIS — F4310 Post-traumatic stress disorder, unspecified: Secondary | ICD-10-CM | POA: Diagnosis not present

## 2019-02-07 MED FILL — MONTELUKAST SOD 10 MG TAB: 10 | 30 days supply | Qty: 30 | Fill #2

## 2019-02-20 DIAGNOSIS — F4310 Post-traumatic stress disorder, unspecified: Secondary | ICD-10-CM | POA: Diagnosis not present

## 2019-02-22 MED FILL — OLOPATADINE HCL 0.2 % SOLN: 0.2 | 25 days supply | Qty: 3 | Fill #1

## 2019-02-22 MED FILL — LEVOTHYROXINE 75 MCG TABLET: 75 | 90 days supply | Qty: 90 | Fill #1

## 2019-03-04 ENCOUNTER — Other Ambulatory Visit: Payer: Self-pay

## 2019-03-04 ENCOUNTER — Encounter: Payer: Self-pay | Admitting: Nurse Practitioner

## 2019-03-04 ENCOUNTER — Telehealth (INDEPENDENT_AMBULATORY_CARE_PROVIDER_SITE_OTHER): Payer: 59 | Admitting: Nurse Practitioner

## 2019-03-04 VITALS — BP 118/88 | HR 86 | Temp 98.1°F | Ht 67.0 in | Wt 211.0 lb

## 2019-03-04 DIAGNOSIS — R42 Dizziness and giddiness: Secondary | ICD-10-CM

## 2019-03-04 DIAGNOSIS — R6889 Other general symptoms and signs: Secondary | ICD-10-CM | POA: Diagnosis not present

## 2019-03-04 DIAGNOSIS — R0981 Nasal congestion: Secondary | ICD-10-CM

## 2019-03-04 DIAGNOSIS — R51 Headache: Secondary | ICD-10-CM

## 2019-03-04 DIAGNOSIS — Z20822 Contact with and (suspected) exposure to covid-19: Secondary | ICD-10-CM

## 2019-03-04 MED ORDER — CHLORPHEN-PE-ACETAMINOPHEN 4-10-325 MG PO TABS: 1.0000 | ORAL_TABLET | Freq: Three times a day (TID) | ORAL | 0 refills | Status: AC | PRN

## 2019-03-04 MED ORDER — MECLIZINE HCL 12.5 MG PO TABS: 12.5000 mg | ORAL_TABLET | Freq: Two times a day (BID) | ORAL | 0 refills | Status: DC | PRN

## 2019-03-04 MED ORDER — GUAIFENESIN ER 600 MG PO TB12: 600.0000 mg | ORAL_TABLET | Freq: Two times a day (BID) | ORAL | 0 refills | Status: DC | PRN

## 2019-03-04 NOTE — Patient Instructions (Addendum)
Maintain adequate oral hydration You will be contacted with results.  Hold astellin, flonase, and allegra while using norel AD  Prevent the Spread of COVID-19 if You Are Sick If you are sick with COVID-19 or think you might have COVID-19, follow the steps below to help protect other people in your home and community. Stay home except to get medical care.  Stay home. Most people with COVID-19 have mild illness and are able to recover at home without medical care. Do not leave your home, except to get medical care. Do not visit public areas.  Take care of yourself. Get rest and stay hydrated.  Get medical care when needed. Call your doctor before you go to their office for care. But, if you have trouble breathing or other concerning symptoms, call 911 for immediate help.  Avoid public transportation, ride-sharing, or taxis. Separate yourself from other people and pets in your home.  As much as possible, stay in a specific room and away from other people and pets in your home. Also, you should use a separate bathroom, if available. If you need to be around other people or animals in or outside of the home, wear a cloth face covering. ? See COVID-19 and Animals if you have questions about pets: RebateDates.com.brhttps://www.cdc.gov/coronavirus/2019-ncov/faq.html#COVID19animals Monitor your symptoms.  Common symptoms of COVID-19 include fever and cough. Trouble breathing is a more serious symptom that means you should get medical attention.  Follow care instructions from your healthcare provider and local health department. Your local health authorities will give instructions on checking your symptoms and reporting information. If you develop emergency warning signs for COVID-19 get medical attention immediately.  Emergency warning signs include*:  Trouble breathing  Persistent pain or pressure in the chest  New confusion or not able to be woken  Bluish lips or face *This list is not all inclusive.  Please consult your medical provider for any other symptoms that are severe or concerning to you. Call 911 if you have a medical emergency. If you have a medical emergency and need to call 911, notify the operator that you have or think you might have, COVID-19. If possible, put on a facemask before medical help arrives. Call ahead before visiting your doctor.  Call ahead. Many medical visits for routine care are being postponed or done by phone or telemedicine.  If you have a medical appointment that cannot be postponed, call your doctor's office. This will help the office protect themselves and other patients. If you are sick, wear a cloth covering over your nose and mouth.  You should wear a cloth face covering over your nose and mouth if you must be around other people or animals, including pets (even at home).  You don't need to wear the cloth face covering if you are alone. If you can't put on a cloth face covering (because of trouble breathing for example), cover your coughs and sneezes in some other way. Try to stay at least 6 feet away from other people. This will help protect the people around you. Note: During the COVID-19 pandemic, medical grade facemasks are reserved for healthcare workers and some first responders. You may need to make a cloth face covering using a scarf or bandana. Cover your coughs and sneezes.  Cover your mouth and nose with a tissue when you cough or sneeze.  Throw used tissues in a lined trash can.  Immediately wash your hands with soap and water for at least 20 seconds. If soap and water  are not available, clean your hands with an alcohol-based hand sanitizer that contains at least 60% alcohol. Clean your hands often.  Wash your hands often with soap and water for at least 20 seconds. This is especially important after blowing your nose, coughing, or sneezing; going to the bathroom; and before eating or preparing food.  Use hand sanitizer if soap and water  are not available. Use an alcohol-based hand sanitizer with at least 60% alcohol, covering all surfaces of your hands and rubbing them together until they feel dry.  Soap and water are the best option, especially if your hands are visibly dirty.  Avoid touching your eyes, nose, and mouth with unwashed hands. Avoid sharing personal household items.  Do not share dishes, drinking glasses, cups, eating utensils, towels, or bedding with other people in your home.  Wash these items thoroughly after using them with soap and water or put them in the dishwasher. Clean all "high-touch" surfaces everyday.  Clean and disinfect high-touch surfaces in your "sick room" and bathroom. Let someone else clean and disinfect surfaces in common areas, but not your bedroom and bathroom.  If a caregiver or other person needs to clean and disinfect a sick person's bedroom or bathroom, they should do so on an as-needed basis. The caregiver/other person should wear a mask and wait as long as possible after the sick person has used the bathroom. High-touch surfaces include phones, remote controls, counters, tabletops, doorknobs, bathroom fixtures, toilets, keyboards, tablets, and bedside tables.  Clean and disinfect areas that may have blood, stool, or body fluids on them.  Use household cleaners and disinfectants. Clean the area or item with soap and water or another detergent if it is dirty. Then use a household disinfectant. ? Be sure to follow the instructions on the label to ensure safe and effective use of the product. Many products recommend keeping the surface wet for several minutes to ensure germs are killed. Many also recommend precautions such as wearing gloves and making sure you have good ventilation during use of the product. ? Most EPA-registered household disinfectants should be effective. How to discontinue home isolation  People with COVID-19 who have stayed home (home isolated) can stop home  isolation under the following conditions: ? If you will not have a test to determine if you are still contagious, you can leave home after these three things have happened:  You have had no fever for at least 72 hours (that is three full days of no fever without the use of medicine that reduces fevers) AND  other symptoms have improved (for example, when your cough or shortness of breath has improved) AND  at least 10 days have passed since your symptoms first appeared. ? If you will be tested to determine if you are still contagious, you can leave home after these three things have happened:  You no longer have a fever (without the use of medicine that reduces fevers) AND  other symptoms have improved (for example, when your cough or shortness of breath has improved) AND  you received two negative tests in a row, 24 hours apart. Your doctor will follow CDC guidelines. In all cases, follow the guidance of your healthcare provider and local health department. The decision to stop home isolation should be made in consultation with your healthcare provider and state and local health departments. Local decisions depend on local circumstances. [employer].co.ukcdc.gov/coronavirus 12/16/2018 This information is not intended to replace advice given to you by your health care provider.  Make sure you discuss any questions you have with your health care provider. Document Released: 11/27/2018 Document Revised: 12/26/2018 Document Reviewed: 11/27/2018 Elsevier Patient Education  [place].

## 2019-03-04 NOTE — Progress Notes (Signed)
Virtual Visit via Video Note  I connected with Katie Bishop on 03/04/19 at  3:30 PM EDT by a video enabled telemedicine application and verified that I am speaking with the correct person using two identifiers.  Location: Patient: in her car with husband Provider: Office   I discussed the limitations of evaluation and management by telemedicine and the availability of in person appointments. The patient expressed understanding and agreed to proceed.  CC: sinus congestion and possible exposure to COVID  History of Present Illness: Sinus Problem This is a new problem. The current episode started in the past 7 days (2days ago). The problem has been gradually worsening since onset. There has been no fever. Associated symptoms include congestion, headaches and sinus pressure. Pertinent negatives include no chills, coughing, diaphoresis, ear pain, hoarse voice, neck pain, shortness of breath, sneezing, sore throat or swollen glands. Past treatments include acetaminophen. The treatment provided mild relief.  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 No distress.  Pulmonary/Chest: Effort normal.  Neurological: She is alert and oriented to person, place, and time.  Psychiatric: She has a normal mood and affect. Her behavior is normal. Thought content normal.  Vitals reviewed.  Assessment and Plan: Timya was seen today for headache.  Diagnoses and all orders for this visit:  Flu-like symptoms -     Cancel: Novel Coronavirus, NAA ([facility]) -     guaiFENesin (MUCINEX) 600 MG 12 hr tablet; Take 1 tablet (600 mg total) by mouth 2 (two) times daily as needed for cough or to loosen phlegm. -     Chlorphen-PE-Acetaminophen 4-10-325 MG TABS; Take 1 tablet by mouth every 8 (eight) hours as needed for up to 3 days.  Vertigo -     meclizine (ANTIVERT) 12.5 MG tablet; Take 1 tablet (12.5 mg  total) by mouth 2 (two) times daily as needed for dizziness (do not use for more than 3days).   Follow Up Instructions: Maintain adequate oral hydration You will be contacted with results Hold astellin, flonase, and allegra while using norel AD   I discussed the assessment and treatment plan with the patient. The patient was provided an opportunity to ask questions and all were answered. The patient agreed with the plan and demonstrated an understanding of the instructions.   The patient was advised to call back or seek an in-person evaluation if the symptoms worsen or if the condition fails to improve as anticipated.  Wilfred Lacy, NP

## 2019-03-07 ENCOUNTER — Encounter: Payer: Self-pay | Admitting: Nurse Practitioner

## 2019-03-07 DIAGNOSIS — F4310 Post-traumatic stress disorder, unspecified: Secondary | ICD-10-CM | POA: Diagnosis not present

## 2019-03-08 ENCOUNTER — Encounter: Payer: Self-pay | Admitting: Nurse Practitioner

## 2019-03-08 DIAGNOSIS — E039 Hypothyroidism, unspecified: Secondary | ICD-10-CM

## 2019-03-08 LAB — NOVEL CORONAVIRUS, NAA: SARS-CoV-2, NAA: NOT DETECTED

## 2019-03-11 ENCOUNTER — Other Ambulatory Visit: Payer: Self-pay | Admitting: Allergy and Immunology

## 2019-03-11 MED FILL — MONTELUKAST SOD 10 MG TAB: 10 | 30 days supply | Qty: 30 | Fill #0

## 2019-03-11 NOTE — Telephone Encounter (Signed)
Courtesy refill of montelukast sent. Pt needs ov for refills.

## 2019-03-19 ENCOUNTER — Telehealth: Payer: Self-pay

## 2019-03-19 NOTE — Telephone Encounter (Signed)

## 2019-03-20 ENCOUNTER — Other Ambulatory Visit: Payer: 59

## 2019-03-25 ENCOUNTER — Telehealth: Payer: Self-pay

## 2019-03-25 NOTE — Telephone Encounter (Signed)

## 2019-03-26 ENCOUNTER — Other Ambulatory Visit: Payer: Self-pay | Admitting: Nurse Practitioner

## 2019-03-26 ENCOUNTER — Other Ambulatory Visit (INDEPENDENT_AMBULATORY_CARE_PROVIDER_SITE_OTHER): Payer: 59

## 2019-03-26 DIAGNOSIS — E559 Vitamin D deficiency, unspecified: Secondary | ICD-10-CM | POA: Diagnosis not present

## 2019-03-26 DIAGNOSIS — E039 Hypothyroidism, unspecified: Secondary | ICD-10-CM

## 2019-03-26 DIAGNOSIS — E782 Mixed hyperlipidemia: Secondary | ICD-10-CM

## 2019-03-26 LAB — LIPID PANEL
Cholesterol: 198 mg/dL (ref 0–200)
HDL: 38.2 mg/dL — ABNORMAL LOW (ref >39.00)
NonHDL: 159.72
Total CHOL/HDL Ratio: 5
Triglycerides: 211 mg/dL — ABNORMAL HIGH (ref 0.0–149.0)
VLDL: 42.2 mg/dL — ABNORMAL HIGH (ref 0.0–40.0)

## 2019-03-26 LAB — T4, FREE: Free T4: 0.82 ng/dL (ref 0.60–1.60)

## 2019-03-26 LAB — TSH: TSH: 7.64 u[IU]/mL — ABNORMAL HIGH (ref 0.35–4.50)

## 2019-03-26 LAB — LDL CHOLESTEROL, DIRECT: Direct LDL: 140 mg/dL

## 2019-03-26 NOTE — Addendum Note (Signed)
Addended by: Lynnea Ferrier on: 03/26/2019 09:43 AM   Modules accepted: Orders

## 2019-03-27 ENCOUNTER — Encounter: Payer: Self-pay | Admitting: Nurse Practitioner

## 2019-03-27 MED ORDER — LEVOTHYROXINE SODIUM 100 MCG PO TABS: 100.0000 ug | ORAL_TABLET | Freq: Every day | ORAL | 0 refills | Status: DC

## 2019-03-27 MED ORDER — OMEGA-3-ACID ETHYL ESTERS 1 G PO CAPS: 1.0000 g | ORAL_CAPSULE | Freq: Two times a day (BID) | ORAL | 5 refills | Status: AC

## 2019-03-27 NOTE — Progress Notes (Signed)
See results note. 

## 2019-03-27 NOTE — Addendum Note (Signed)
Addended by: Wilfred Lacy L on: 03/27/2019 01:51 PM   Modules accepted: Orders

## 2019-03-29 LAB — VITAMIN D 1,25 DIHYDROXY
Vitamin D 1, 25 (OH)2 Total: 37 pg/mL (ref 18–72)
Vitamin D2 1, 25 (OH)2: <8 pg/mL
Vitamin D3 1, 25 (OH)2: 37 pg/mL

## 2019-04-09 ENCOUNTER — Encounter: Payer: Self-pay | Admitting: Nurse Practitioner

## 2019-04-09 DIAGNOSIS — J4520 Mild intermittent asthma, uncomplicated: Secondary | ICD-10-CM

## 2019-04-09 DIAGNOSIS — J3089 Other allergic rhinitis: Secondary | ICD-10-CM

## 2019-04-10 MED ORDER — AZELASTINE HCL 0.1 % NA SOLN: 2.0000 | Freq: Two times a day (BID) | NASAL | 2 refills | Status: DC

## 2019-04-10 MED ORDER — MONTELUKAST SODIUM 10 MG PO TABS: 10.0000 mg | ORAL_TABLET | Freq: Every day | ORAL | 0 refills | Status: DC

## 2019-04-15 ENCOUNTER — Telehealth: Payer: Self-pay | Admitting: Nurse Practitioner

## 2019-04-15 DIAGNOSIS — J3089 Other allergic rhinitis: Secondary | ICD-10-CM

## 2019-04-15 DIAGNOSIS — J4520 Mild intermittent asthma, uncomplicated: Secondary | ICD-10-CM

## 2019-04-15 MED ORDER — MONTELUKAST SODIUM 10 MG PO TABS: 10.0000 mg | ORAL_TABLET | Freq: Every day | ORAL | 0 refills | Status: DC

## 2019-04-15 MED ORDER — AZELASTINE HCL 0.1 % NA SOLN: 2.0000 | Freq: Two times a day (BID) | NASAL | 2 refills | Status: AC

## 2019-04-15 MED FILL — MONTELUKAST SOD 10 MG TAB: 10 | 90 days supply | Qty: 90 | Fill #0

## 2019-04-15 MED FILL — AZELASTINE HCL 137 MCG SPRY: 0.1 | 25 days supply | Qty: 30 | Fill #0

## 2019-04-15 NOTE — Telephone Encounter (Signed)
Rx cancel from [facility] and resend to [facility] as pt requested.

## 2019-05-02 ENCOUNTER — Encounter: Payer: Self-pay | Admitting: Nurse Practitioner

## 2019-05-02 ENCOUNTER — Telehealth (INDEPENDENT_AMBULATORY_CARE_PROVIDER_SITE_OTHER): Payer: 59 | Admitting: Nurse Practitioner

## 2019-05-02 ENCOUNTER — Ambulatory Visit: Payer: Self-pay

## 2019-05-02 ENCOUNTER — Other Ambulatory Visit: Payer: Self-pay

## 2019-05-02 VITALS — HR 84 | Temp 97.8°F | Ht 67.0 in | Wt 209.0 lb

## 2019-05-02 DIAGNOSIS — J3089 Other allergic rhinitis: Secondary | ICD-10-CM | POA: Diagnosis not present

## 2019-05-02 DIAGNOSIS — J4521 Mild intermittent asthma with (acute) exacerbation: Secondary | ICD-10-CM

## 2019-05-02 MED ORDER — GUAIFENESIN-DM 100-10 MG/5ML PO SYRP: 10.0000 mL | ORAL_SOLUTION | Freq: Four times a day (QID) | ORAL | 0 refills | Status: DC | PRN

## 2019-05-02 NOTE — Telephone Encounter (Signed)
Patient called stating that she has a tightness in her breathing. She has a dry cough. Her symptoms started 2-3 days ago.  She know of no known exposure to COVID-19. 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 She denies fever, sore throat.  She had a headache yesterday. She feels that her symptoms are getting worse. Care advice read to patient. She verbalized understanding of all instructions.Pt was told about our testing sites and the hours of operation. Call transferred to office for scheduling.  Reason for Disposition . [1] COVID-19 infection suspected by caller or triager AND [2] mild symptoms (cough, fever, or others) AND [4] no complications or SOB  Answer Assessment - Initial Assessment Questions 1. COVID-19 DIAGNOSIS: "Who made your Coronavirus (COVID-19) diagnosis?" "Was it confirmed by a positive lab test?" If not diagnosed by a HCP, ask "Are there lots of cases (community spread) where you live?" (See public health department website, if unsure)     [employer] work in [employer] 2. ONSET: "When did the COVID-19 symptoms start?"      Cough 2-3 days 3. WORST SYMPTOM: "What is your worst symptom?" (e.g., cough, fever, shortness of breath, muscle aches)     Tight chest 4. COUGH: "Do you have a cough?" If so, ask: "How bad is the cough?"      Dry getting worse 5. FEVER: "Do you have a fever?" If so, ask: "What is your temperature, how was it measured, and when did it start?"     no 6. RESPIRATORY STATUS: "Describe your breathing?" (e.g., shortness of breath, wheezing, unable to speak)      Tight to breath 7. BETTER-SAME-WORSE: "Are you getting better, staying the same or getting worse compared to yesterday?"  If getting worse, ask, "In what way?"    worse 8. HIGH RISK DISEASE: "Do you have any chronic medical problems?" (e.g., asthma, heart or lung disease, weak immune system, etc.)     Allergies no asthma 9. PREGNANCY: "Is there any chance you are  pregnant?" "When was your last menstrual period?"    No period now 10. OTHER SYMPTOMS: "Do you have any other symptoms?"  (e.g., chills, fatigue, headache, loss of smell or taste, muscle pain, sore throat)        Headache yesterday,  Protocols used: CORONAVIRUS (COVID-19) DIAGNOSED OR SUSPECTED-A-AH

## 2019-05-02 NOTE — Patient Instructions (Addendum)
No need for COVID test at this time. You will be provided with work note for today till Sunday. Ok to return to work, if no fever>100. Use Albuterol 2puffs TID x 1day, then 2puffs BID x2days, then as needed for chest tighness and wheezing. Use robitussin and/or benzonatate as needed for cough. Resume astellin and flonase nasal sprays: astellin BID and flonase once a day. Maintain adequate oral hydration.

## 2019-05-02 NOTE — Progress Notes (Signed)
Virtual Visit via Video Note  I connected with Katie Bishop on 05/02/19 at  3:00 PM EDT by a video enabled telemedicine application and verified that I am speaking with the correct person using two identifiers.  Location: Patient: home Provider: office   I discussed the limitations of evaluation and management by telemedicine and the availability of in person appointments. The patient expressed understanding and agreed to proceed.  CC: cough, nasal congestion, chest tightness x 2days.  History of Present Illness: Cough This is a recurrent problem. The current episode started in the past 7 days. The problem has been unchanged. The cough is productive of sputum. Associated symptoms include nasal congestion, postnasal drip, rhinorrhea and wheezing. Pertinent negatives include no chest pain, chills, ear congestion, ear pain, fever, headaches, heartburn, hemoptysis, myalgias, rash, sore throat, shortness of breath, sweats or weight loss. Nothing aggravates the symptoms. She has tried a beta-agonist inhaler for the symptoms. The treatment provided mild relief. Her past medical history is significant for asthma and environmental allergies.    Observations/Objective: Physical Exam  Constitutional: She is oriented to person, place, and time. No distress.  Respiratory: Effort normal.  Neurological: She is alert and oriented to person, place, and time.   Assessment and Plan: [name] was seen today for cough.  Diagnoses and all orders for this visit:  Mild intermittent asthma with acute exacerbation -     guaiFENesin-dextromethorphan (ROBITUSSIN DM) 100-10 MG/5ML syrup; Take 10 mLs by mouth every 6 (six) hours as needed for cough.  Perennial and seasonal allergic rhinitis -     guaiFENesin-dextromethorphan (ROBITUSSIN DM) 100-10 MG/5ML syrup; Take 10 mLs by mouth every 6 (six) hours as needed for cough.   Follow Up Instructions: No need for COVID test at this time. You will be provided with  work note for today till Sunday. Ok to return to work, if no fever>100. Use Albuterol 2puffs TID x 1day, then 2puffs BID x2days, then as needed for chest tighness and wheezing. Use robitussin and/or benzonatate as needed for cough. Resume astellin and flonase nasal sprays: astellin BID and flonase once a day. Maintain adequate oral hydration.   I discussed the assessment and treatment plan with the patient. The patient was provided an opportunity to ask questions and all were answered. The patient agreed with the plan and demonstrated an understanding of the instructions.   The patient was advised to call back or seek an in-person evaluation if the symptoms worsen or if the condition fails to improve as anticipated.   Wilfred Lacy, NP

## 2019-05-08 DIAGNOSIS — F438 Other reactions to severe stress: Secondary | ICD-10-CM | POA: Diagnosis not present

## 2019-05-21 DIAGNOSIS — F438 Other reactions to severe stress: Secondary | ICD-10-CM | POA: Diagnosis not present

## 2019-06-06 DIAGNOSIS — F438 Other reactions to severe stress: Secondary | ICD-10-CM | POA: Diagnosis not present

## 2019-06-11 ENCOUNTER — Telehealth: Payer: Self-pay | Admitting: Nurse Practitioner

## 2019-06-11 NOTE — Telephone Encounter (Signed)

## 2019-06-12 ENCOUNTER — Other Ambulatory Visit: Payer: Self-pay

## 2019-06-12 ENCOUNTER — Encounter: Payer: Self-pay | Admitting: Nurse Practitioner

## 2019-06-12 ENCOUNTER — Ambulatory Visit (INDEPENDENT_AMBULATORY_CARE_PROVIDER_SITE_OTHER): Payer: 59 | Admitting: Nurse Practitioner

## 2019-06-12 VITALS — BP 120/82 | HR 84 | Temp 97.5°F | Ht 67.0 in | Wt 213.8 lb

## 2019-06-12 DIAGNOSIS — F321 Major depressive disorder, single episode, moderate: Secondary | ICD-10-CM | POA: Diagnosis not present

## 2019-06-12 DIAGNOSIS — R41840 Attention and concentration deficit: Secondary | ICD-10-CM | POA: Diagnosis not present

## 2019-06-12 DIAGNOSIS — E039 Hypothyroidism, unspecified: Secondary | ICD-10-CM

## 2019-06-12 DIAGNOSIS — Z0001 Encounter for general adult medical examination with abnormal findings: Secondary | ICD-10-CM

## 2019-06-12 DIAGNOSIS — F411 Generalized anxiety disorder: Secondary | ICD-10-CM | POA: Diagnosis not present

## 2019-06-12 DIAGNOSIS — Z90721 Acquired absence of ovaries, unilateral: Secondary | ICD-10-CM | POA: Diagnosis not present

## 2019-06-12 DIAGNOSIS — E781 Pure hyperglyceridemia: Secondary | ICD-10-CM

## 2019-06-12 DIAGNOSIS — Z23 Encounter for immunization: Secondary | ICD-10-CM | POA: Diagnosis not present

## 2019-06-12 LAB — LIPID PANEL
Cholesterol: 190 mg/dL (ref 0–200)
HDL: 32.2 mg/dL — ABNORMAL LOW (ref >39.00)
LDL Cholesterol: 138 mg/dL — ABNORMAL HIGH (ref 0–99)
NonHDL: 158
Total CHOL/HDL Ratio: 6
Triglycerides: 98 mg/dL (ref 0.0–149.0)
VLDL: 19.6 mg/dL (ref 0.0–40.0)

## 2019-06-12 LAB — COMPREHENSIVE METABOLIC PANEL
ALT: 18 U/L (ref 0–35)
AST: 14 U/L (ref 0–37)
Albumin: 4.3 g/dL (ref 3.5–5.2)
Alkaline Phosphatase: 79 U/L (ref 39–117)
BUN: 12 mg/dL (ref 6–23)
CO2: 28 mEq/L (ref 19–32)
Calcium: 8.9 mg/dL (ref 8.4–10.5)
Chloride: 105 mEq/L (ref 96–112)
Creatinine, Ser: 0.8 mg/dL (ref 0.40–1.20)
GFR: 80.23 mL/min (ref >60.00)
Glucose, Bld: 101 mg/dL — ABNORMAL HIGH (ref 70–99)
Potassium: 4 mEq/L (ref 3.5–5.1)
Sodium: 138 mEq/L (ref 135–145)
Total Bilirubin: 0.3 mg/dL (ref 0.2–1.2)
Total Protein: 6.7 g/dL (ref 6.0–8.3)

## 2019-06-12 LAB — CBC
HCT: 38 % (ref 36.0–46.0)
Hemoglobin: 12.7 g/dL (ref 12.0–15.0)
MCHC: 33.4 g/dL (ref 30.0–36.0)
MCV: 85.5 fl (ref 78.0–100.0)
Platelets: 396 10*3/uL (ref 150.0–400.0)
RBC: 4.44 Mil/uL (ref 3.87–5.11)
RDW: 14.7 % (ref 11.5–15.5)
WBC: 6.1 10*3/uL (ref 4.0–10.5)

## 2019-06-12 NOTE — Assessment & Plan Note (Signed)
Chronic per patient, waxing and waning Acute exacerbation 6months ago due to husband's infidelity, current pandemic which has led to decreased work hours. reporst she is contemplating sepeartion from husband but unable to do that now due to financial strain. Reports she feels trapped in her marriage. Medications used in past: lexapro (anhedonia), prozac, zoloft (headache), Wellbutrin (mood swings) PMP reviewed: lorazepam 0.5 last filled 2018, #30tabs  Today she reports improved mood with counseling session(individual and couple). reports stable and improved mood but has difficulty with completing tasks in last 2-3months at work and with school. Denies any hx of ADD or ADHD as a child. Will like referral to psychiatry for ADD eval. Referral entered 

## 2019-06-12 NOTE — Assessment & Plan Note (Addendum)
Chronic per patient, waxing and waning Acute exacerbation 21months ago due to husband's infidelity, current pandemic which has led to decreased work hours. reporst she is contemplating sepeartion from husband but unable to do that now due to financial strain. Reports she feels trapped in her marriage. Medications used in past: lexapro (anhedonia), prozac, zoloft (headache), Wellbutrin (mood swings) PMP reviewed: lorazepam 0.5 last filled 2018, #30tabs  Today she reports improved mood with counseling session(individual and couple). reports stable and improved mood but has difficulty with completing tasks in last 2-51months at work and with school. Denies any hx of ADD or ADHD as a child. Will like referral to psychiatry for ADD eval. Referral entered

## 2019-06-12 NOTE — Patient Instructions (Addendum)
You will be contacted to schedule appt with psychitary Call office if you have not been contacted within 2weeks.  Improved thyroid function. Maintain current levothyroxine dose. Normal cbc and cmp Abnormal lipid panel: low HDL and high LDL. Normal triglyceride. Recommend heart healthy diet and regular exercise  Schedule appt with GYN for breast and pelvic exam.  Ways to help with Attention Deficit:  Do not use drugs or abuse alcohol. Limit alcohol intake to no more than 1 drink a day for nonpregnant women and 2 drinks a day for men. One drink equals 12 oz of beer, 5 oz of wine, or 1 oz of hard liquor.  Follow the same schedule each day. Make sure your schedule includes enough time for you to get plenty of sleep.  Use reminder devices like notes, calendars, and phone apps to stay on-time and organized.  Eat a healthy diet. Do not skip meals.  Exercise regularly. Exercise can help to reduce stress and anxiety.  Keep all follow-up visits as told by your health care provider and therapist. This is important.   Create structure and an organized atmosphere at home. For example: ? Make a list of tasks, then rank them from most important to least important. Work on one task at a time until your listed tasks are done. ? Make a daily schedule and follow it consistently every day. ? Use an appointment calendar, and check it 2 or 3 times a day to keep on track. Keep it with you when you leave the house. ? Create spaces where you keep certain things, and always put things back in their places after you use them.   Health Maintenance, Female Adopting a healthy lifestyle and getting preventive care are important in promoting health and wellness. Ask your health care provider about:  The right schedule for you to have regular tests and exams.  Things you can do on your own to prevent diseases and keep yourself healthy. What should I know about diet, weight, and exercise? Eat a healthy diet    Eat a diet that includes plenty of vegetables, fruits, low-fat dairy products, and lean protein.  Do not eat a lot of foods that are high in solid fats, added sugars, or sodium. Maintain a healthy weight Body mass index (BMI) is used to identify weight problems. It estimates body fat based on height and weight. Your health care provider can help determine your BMI and help you achieve or maintain a healthy weight. Get regular exercise Get regular exercise. This is one of the most important things you can do for your health. Most adults should:  Exercise for at least 150 minutes each week. The exercise should increase your heart rate and make you sweat (moderate-intensity exercise).  Do strengthening exercises at least twice a week. This is in addition to the moderate-intensity exercise.  Spend less time sitting. Even light physical activity can be beneficial. Watch cholesterol and blood lipids Have your blood tested for lipids and cholesterol at 38 years of age, then have this test every 5 years. Have your cholesterol levels checked more often if:  Your lipid or cholesterol levels are high.  You are older than 38 years of age.  You are at high risk for heart disease. What should I know about cancer screening? Depending on your health history and family history, you may need to have cancer screening at various ages. This may include screening for:  Breast cancer.  Cervical cancer.  Colorectal cancer.  Skin cancer.  Lung cancer. What should I know about heart disease, diabetes, and high blood pressure? Blood pressure and heart disease  High blood pressure causes heart disease and increases the risk of stroke. This is more likely to develop in people who have high blood pressure readings, are of African descent, or are overweight.  Have your blood pressure checked: ? Every 3-5 years if you are 55-51 years of age. ? Every year if you are 22 years old or older. Diabetes Have  regular diabetes screenings. This checks your fasting blood sugar level. Have the screening done:  Once every three years after age 69 if you are at a normal weight and have a low risk for diabetes.  More often and at a younger age if you are overweight or have a high risk for diabetes. What should I know about preventing infection? Hepatitis B If you have a higher risk for hepatitis B, you should be screened for this virus. Talk with your health care provider to find out if you are at risk for hepatitis B infection. Hepatitis C Testing is recommended for:  Everyone born from [birth year] through 1965.  Anyone with known risk factors for hepatitis C. Sexually transmitted infections (STIs)  Get screened for STIs, including gonorrhea and chlamydia, if: ? You are sexually active and are younger than 38 years of age. ? You are older than 38 years of age and your health care provider tells you that you are at risk for this type of infection. ? Your sexual activity has changed since you were last screened, and you are at increased risk for chlamydia or gonorrhea. Ask your health care provider if you are at risk.  Ask your health care provider about whether you are at high risk for HIV. Your health care provider may recommend a prescription medicine to help prevent HIV infection. If you choose to take medicine to prevent HIV, you should first get tested for HIV. You should then be tested every 3 months for as long as you are taking the medicine. Pregnancy  If you are about to stop having your period (premenopausal) and you may become pregnant, seek counseling before you get pregnant.  Take 400 to 800 micrograms (mcg) of folic acid every day if you become pregnant.  Ask for birth control (contraception) if you want to prevent pregnancy. Osteoporosis and menopause Osteoporosis is a disease in which the bones lose minerals and strength with aging. This can result in bone fractures. If you are 58 years  old or older, or if you are at risk for osteoporosis and fractures, ask your health care provider if you should:  Be screened for bone loss.  Take a calcium or vitamin D supplement to lower your risk of fractures.  Be given hormone replacement therapy (HRT) to treat symptoms of menopause. Follow these instructions at home: Lifestyle  Do not use any products that contain nicotine or tobacco, such as cigarettes, e-cigarettes, and chewing tobacco. If you need help quitting, ask your health care provider.  Do not use street drugs.  Do not share needles.  Ask your health care provider for help if you need support or information about quitting drugs. Alcohol use  Do not drink alcohol if: ? Your health care provider tells you not to drink. ? You are pregnant, may be pregnant, or are planning to become pregnant.  If you drink alcohol: ? Limit how much you use to 0-1 drink a day. ? Limit intake if you are breastfeeding.  Be aware of how much alcohol is in your drink. In the [place] one drink equals one 12 oz bottle of beer (355 mL), one 5 oz glass of wine (148 mL), or one 1 oz glass of hard liquor (44 mL). General instructions  Schedule regular health, dental, and eye exams.  Stay current with your vaccines.  Tell your health care provider if: ? You often feel depressed. ? You have ever been abused or do not feel safe at home. Summary  Adopting a healthy lifestyle and getting preventive care are important in promoting health and wellness.  Follow your health care provider's instructions about healthy diet, exercising, and getting tested or screened for diseases.  Follow your health care provider's instructions on monitoring your cholesterol and blood pressure. This information is not intended to replace advice given to you by your health care provider. Make sure you discuss any questions you have with your health care provider. Document Released: 02/14/2011 Document Revised:  07/25/2018 Document Reviewed: 07/25/2018 Elsevier Patient Education  2020 [employer]

## 2019-06-12 NOTE — Progress Notes (Addendum)
Subjective:    Patient ID: Katie Bishop, female    DOB: 1981-05-09, 38 y.o.   MRN: [id number]  Patient presents today for complete physical and eval of chronic condition  HPI   Hypertriglyceride: Not taking lovaza. Denies any GI symptoms Needs repeat lipid panel today.  Hypothyroidism: Stable mood and weight. Current use of levothyroxine 142mg.  Asthma: Controlled, no use of albuterol Use of singulair, allegra, and flonase Depression and Anxiety: Ongoing therapy sessions, reports stable and improved mood but has difficulty with completing tasks in last 2-378monthat work and with school. Denies any hx of ADD or ADHD as a child. She was unable to tolerate Wellbutrin (mood swings). Will like referral to psychiatry for ADD eval. [facility] 06/12/2019 12/10/2018 02/14/2018  Decreased Interest 0 0 0  Down, Depressed, Hopeless 0 1 0  PHQ - 2 Score 0 1 0  Altered sleeping - 3 -  Tired, decreased energy - 2 -  Change in appetite - 0 -  Feeling bad or failure about yourself  - 1 -  Trouble concentrating - 3 -  Moving slowly or fidgety/restless - 0 -  Suicidal thoughts - 0 -  PHQ-9 Score - 10 -   GAD 7 : Generalized Anxiety Score 12/10/2018  Nervous, Anxious, on Edge 3  Control/stop worrying 3  Worry too much - different things 3  Trouble relaxing 3  Restless 1  Easily annoyed or irritable 2  Afraid - awful might happen 1  Total GAD 7 Score 16  Anxiety Difficulty Somewhat difficult   Sexual History (orientation,birth control, marital status, STD):married, sexually active, s/p bilateral salpingectomy and right oophorectomy. Will schedule breast and pelvic exam with GYN  Vision:up to date  Dental:up to date  Immunizations: (TDAP, Hep C screen, Pneumovax, Influenza, zoster)  Health Maintenance  Topic Date Due  . Pap Smear  04/30/2002  . Tetanus Vaccine  06/11/2020*  . HIV Screening  06/11/2020*  . Flu Shot  Completed  *Topic was postponed. The date shown is  not the original due date.   Diet:regular.  Weight:  Wt Readings from Last 3 Encounters:  06/12/19 213 lb 12.8 oz (97 kg)  05/02/19 209 lb (94.8 kg)  03/04/19 211 lb (95.7 kg)    Exercise:walking 30-4572m 2-3x/week  Fall Risk: Fall Risk  06/12/2019 12/10/2018 02/14/2018  Falls in the past year? 0 0 Yes  Number falls in past yr: - - 1  Injury with Fall? - - No   Advanced Directive: Advanced Directives 04/20/2018  Does Patient Have a Medical Advance Directive? No  Some encounter information is confidential and restricted. Go to Review Flowsheets activity to see all data.    Medications and allergies reviewed with patient and updated if appropriate.  Patient Active Problem List   Diagnosis Date Noted  . S/P right oophorectomy 06/12/2019  . GAD (generalized anxiety disorder) 12/10/2018  . Depression[place] major[place]  . PAC (premature atrial contraction) 10/12/2018  . Vertigo 10/12/2018  . Mild intermittent asthma 07/04/2018  . Gastroesophageal reflux disease 02/14/2018  . Perennial and seasonal allergic rhinitis 02/14/2018  . Tobacco use 02/14/2018  . Hypothyroidism 02/14/2018  . Vitamin D deficiency 02/14/2018  . Menorrhagia with irregular cycle 02/14/2018   Current Outpatient Medications on File Prior to Visit  Medication Sig Dispense Refill  . Acetaminophen (TYLENOL PO) Take by mouth.    . aMarland Kitchenbuterol (PROVENTIL HFA;VENTOLIN HFA) 108 (90 Base) MCG/ACT inhaler Inhale 1-2 puffs into the lungs  every 6 (six) hours as needed for wheezing or shortness of breath. 18 g 1  . azelastine (ASTELIN) 0.1 % nasal spray Place 2 sprays into both nostrils 2 (two) times daily. Use in each nostril as directed. Need OV for further refills 30 mL 2  . Calcium Carb-Cholecalciferol (CALCIUM 600+D3) 600-800 MG-UNIT TABS Take 1 tablet by mouth daily.    . famotidine (PEPCID) 20 MG tablet Take 1 tablet (20 mg total) by mouth at bedtime. 90 tablet 3  . Fexofenadine HCl  (ALLEGRA PO) Take by mouth.    . fluticasone (FLONASE) 50 MCG/ACT nasal spray Place 2 sprays into both nostrils at bedtime. 16 g 1  . montelukast (SINGULAIR) 10 MG tablet Take 1 tablet (10 mg total) by mouth at bedtime. Needs OV for further refills 90 tablet 0  . omega-3 acid ethyl esters (LOVAZA) 1 g capsule Take 1 capsule (1 g total) by mouth 2 (two) times daily. (Patient not taking: Reported on 06/12/2019) 60 capsule 5   No current facility-administered medications on file prior to visit.     Past Medical History:  Diagnosis Date  . Allergy   . Anxiety   . Eczema   . GERD (gastroesophageal reflux disease)   . Hypothyroidism   . IBS (irritable bowel syndrome)   . Other abnormalities of heart beat    heart arrhythmai PACs PVCs  . PVC's (premature ventricular contractions)   . Seasonal allergies   . SVD (spontaneous vaginal delivery)    x 2 at [place]  . Thyroid disease     Past Surgical History:  Procedure Laterality Date  . CHOLECYSTECTOMY  2013   [place]  . COLONOSCOPY    . DIAGNOSTIC LAPAROSCOPY     x 2 - diagnostic/cysts  . [place]ECTOMY Right [place]  . OTHER SURGICAL HISTORY  2016   sinus surgery in [place]  . TUBAL LIGATION Bilateral 2017  . UPPER GI ENDOSCOPY    . WISDOM TOOTH EXTRACTION      Social History   Socioeconomic History  . Marital status: Married    Spouse name: Not on file  . Number of children: Not on file  . Years of education: Not on file  . Highest education level: Not on file  Occupational History  . Not on file  Social Needs  . Financial resource strain: Not on file  . Food insecurity    Worry: Not on file    Inability: Not on file  . Transportation needs    Medical: Not on file    Non-medical: Not on file  Tobacco Use  . Smoking status: Current Every Day Smoker    Packs/day: 0.50    Years: 18.00    Pack years: 9.00    Types: Cigarettes  . Smokeless tobacco: Never Used  Substance and Sexual Activity  .  Alcohol use: Yes    Comment: social   . Drug use: Never  . Sexual activity: Yes    Birth control/protection: Surgical  Lifestyle  . Physical activity    Days per week: Not on file    Minutes per session: Not on file  . Stress: Not on file  Relationships  . Social Herbalist on phone: Not on file    Gets together: Not on file    Attends religious service: Not on file    Active member of Bishop or organization: Not on file    Attends meetings of clubs or organizations: Not on  file    Relationship status: Not on file  Other Topics Concern  . Not on file  Social History Narrative  . Not on file    Family History  Problem Relation Age of Onset  . Hyperlipidemia Mother   . Hypertension Mother   . Mitral valve prolapse Mother   . Hypothyroidism Mother   . Allergic rhinitis Mother   . Hyperlipidemia Father   . Allergic rhinitis Father   . Multiple myeloma Maternal Grandmother   . Anuerysm Paternal Grandmother        brain  . Cancer Paternal Grandfather        lung cancer  . Cancer Maternal Aunt        breast cancer  . Cancer Maternal Aunt        breast cancer  . Cancer Maternal Aunt        breast cancer  . Asthma Sister   . Allergic rhinitis Sister   . Asthma Daughter   . Allergic rhinitis Daughter   . Allergic rhinitis Daughter   . Eczema Neg Hx   . Urticaria Neg Hx   . Immunodeficiency Neg Hx   . Atopy Neg Hx   . Angioedema Neg Hx         Review of Systems  Constitutional: Negative for fever, malaise/fatigue and weight loss.  HENT: Negative for congestion and sore throat.   Eyes:       Negative for visual changes  Respiratory: Negative for cough and shortness of breath.   Cardiovascular: Negative for chest pain, palpitations and leg swelling.  Gastrointestinal: Negative for blood in stool, constipation, diarrhea and heartburn.  Genitourinary: Negative for dysuria, frequency and urgency.  Musculoskeletal: Negative for falls, joint pain and myalgias.   Skin: Negative for rash.  Neurological: Negative for dizziness, sensory change and headaches.  Endo/Heme/Allergies: Does not bruise/bleed easily.  Psychiatric/Behavioral: Negative for depression, substance abuse and suicidal ideas. The patient is nervous/anxious. The patient does not have insomnia.     Objective:   Vitals:   06/12/19 0849  BP: 120/82  Pulse: 84  Temp: (!) 97.5 F (36.4 C)  SpO2: 99%   Body mass index is 33.49 kg/m.  Physical Examination:  Physical Exam Constitutional:      General: She is not in acute distress.    Appearance: She is obese.  HENT:     Head: Normocephalic.     Right Ear: Tympanic membrane, ear canal and external ear normal.     Left Ear: Tympanic membrane, ear canal and external ear normal.  Eyes:     General: No scleral icterus.    Extraocular Movements: Extraocular movements intact.     Conjunctiva/sclera: Conjunctivae normal.     Pupils: Pupils are equal, round, and reactive to light.  Neck:     Musculoskeletal: Normal range of motion and neck supple.     Thyroid: No thyromegaly.  Cardiovascular:     Rate and Rhythm: Normal rate.     Heart sounds: Normal heart sounds.  Pulmonary:     Effort: Pulmonary effort is normal.     Breath sounds: Normal breath sounds.  Chest:     Chest wall: No tenderness.  Abdominal:     General: Bowel sounds are normal. There is no distension.     Palpations: Abdomen is soft.     Tenderness: There is no abdominal tenderness.  Genitourinary:    Comments: Deferred breast and pelvic exam to GYN per patient Musculoskeletal: Normal range of motion.  General: No tenderness.     Right lower leg: No edema.     Left lower leg: No edema.  Lymphadenopathy:     Cervical: No cervical adenopathy.  Skin:    General: Skin is warm and dry.  Neurological:     Mental Status: She is alert and oriented to person, place, and time.  Psychiatric:        Mood and Affect: Mood normal.        Behavior: Behavior  normal.        Thought Content: Thought content normal.    ASSESSMENT and PLAN:  Katie Bishop was seen today for annual exam.  Diagnoses and all orders for this visit:  Encounter for preventative adult health care exam with abnormal findings -     Comprehensive metabolic panel -     CBC  Hypothyroidism, unspecified type -     TSH -     T4, free -     levothyroxine (SYNTHROID) 100 MCG tablet; Take 1 tablet (100 mcg total) by mouth at bedtime.  GAD (generalized anxiety disorder) -     Ambulatory referral to Psychiatry  Depression, major, single episode, moderate ([place]) -     Ambulatory referral to Psychiatry  Attention and concentration deficit -     Ambulatory referral to Psychiatry  Pure hypertriglyceridemia -     Lipid panel  Need for influenza vaccination -     Flu Vaccine QUAD 36+ mos IM  S/P right oophorectomy    Depression, major, single episode, moderate (H[facility]) Chronic per patient, waxing and waning Acute exacerbation 70month ago due to husband's infidelity, current pandemic which has led to decreased work hours. reporst she is contemplating sepeartion from husband but unable to do that now due to financial strain. Reports she feels trapped in her marriage. Medications used in past: lexapro (anhedonia), prozac, zoloft (headache), Wellbutrin (mood swings) PMP reviewed: lorazepam 0.5 last filled 2018, #30tabs  Today she reports improved mood with counseling session(individual and couple). reports stable and improved mood but has difficulty with completing tasks in last 2-348monthat work and with school. Denies any hx of ADD or ADHD as a child. Will like referral to psychiatry for ADD eval. Referral entered  GAD (generalized anxiety disorder) Chronic per patient, waxing and waning Acute exacerbation 32m57monthgo due to husband's infidelity, current pandemic which has led to decreased work hours. reporst she is contemplating sepeartion from husband but unable to do that  now due to financial strain. Reports she feels trapped in her marriage. Medications used in past: lexapro (anhedonia), prozac, zoloft (headache), Wellbutrin (mood swings) PMP reviewed: lorazepam 0.5 last filled 2018, #30tabs  Today she reports improved mood with counseling session(individual and couple). reports stable and improved mood but has difficulty with completing tasks in last 2-50mo70month work and with school. Denies any hx of ADD or ADHD as a child. Will like referral to psychiatry for ADD eval. Referral entered       Problem List Items Addressed This Visit      Endocrine   Hypothyroidism   Relevant Medications   levothyroxine (SYNTHROID) 100 MCG tablet   Other Relevant Orders   TSH (Completed)   T4, free (Completed)     Other   Depression, major, single episode, moderate (HCC)Katie Bishop Chronic per patient, waxing and waning Acute exacerbation 32mon62month due to husband's infidelity, current pandemic which has led to decreased work hours. reporst she is contemplating sepeartion from husband but unable to do  that now due to financial strain. Reports she feels trapped in her marriage. Medications used in past: lexapro (anhedonia), prozac, zoloft (headache), Wellbutrin (mood swings) PMP reviewed: lorazepam 0.5 last filled 2018, #30tabs  Today she reports improved mood with counseling session(individual and couple). reports stable and improved mood but has difficulty with completing tasks in last 2-64month at work and with school. Denies any hx of ADD or ADHD as a child. Will like referral to psychiatry for ADD eval. Referral entered      Relevant Orders   Ambulatory referral to Psychiatry   GAD (generalized anxiety disorder)    Chronic per patient, waxing and waning Acute exacerbation 67monthago due to husband's infidelity, current pandemic which has led to decreased work hours. reporst she is contemplating sepeartion from husband but unable to do that now due to financial  strain. Reports she feels trapped in her marriage. Medications used in past: lexapro (anhedonia), prozac, zoloft (headache), Wellbutrin (mood swings) PMP reviewed: lorazepam 0.5 last filled 2018, #30tabs  Today she reports improved mood with counseling session(individual and couple). reports stable and improved mood but has difficulty with completing tasks in last 2-8m18montht work and with school. Denies any hx of ADD or ADHD as a child. Will like referral to psychiatry for ADD eval. Referral entered       Relevant Orders   Ambulatory referral to Psychiatry   S/P right oophorectomy    Other Visit Diagnoses    Encounter for preventative adult health care exam with abnormal findings    -  Primary   Relevant Orders   Comprehensive metabolic panel (Completed)   CBC (Completed)   Attention and concentration deficit       Relevant Orders   Ambulatory referral to Psychiatry   Pure hypertriglyceridemia       Relevant Orders   Lipid panel (Completed)   Need for influenza vaccination       Relevant Orders   Flu Vaccine QUAD 36+ mos IM (Completed)      Follow up: Return in about 6 months (around 12/11/2019) for hypothyroidism..  Wilfred LacyP

## 2019-06-13 LAB — TSH: TSH: 2.76 u[IU]/mL (ref 0.35–4.50)

## 2019-06-13 LAB — T4, FREE: Free T4: 0.94 ng/dL (ref 0.60–1.60)

## 2019-06-14 MED ORDER — LEVOTHYROXINE SODIUM 100 MCG PO TABS: 100.0000 ug | ORAL_TABLET | Freq: Every day | ORAL | 1 refills | Status: DC

## 2019-06-14 MED FILL — LEVOTHYROXINE 100 MCG TAB: 100 | 90 days supply | Qty: 90 | Fill #0

## 2019-06-14 NOTE — Addendum Note (Signed)
Addended by: Wilfred Lacy L on: 06/14/2019 12:35 PM   Modules accepted: Orders

## 2019-06-16 ENCOUNTER — Emergency Department (HOSPITAL_BASED_OUTPATIENT_CLINIC_OR_DEPARTMENT_OTHER): Payer: 59

## 2019-06-16 ENCOUNTER — Other Ambulatory Visit: Payer: Self-pay

## 2019-06-16 ENCOUNTER — Emergency Department (HOSPITAL_BASED_OUTPATIENT_CLINIC_OR_DEPARTMENT_OTHER)
Admission: EM | Admit: 2019-06-16 | Discharge: 2019-06-16 | Disposition: A | Discharge status: Home / Self Care | Payer: 59 | Attending: Emergency Medicine | Admitting: Emergency Medicine

## 2019-06-16 ENCOUNTER — Encounter (HOSPITAL_BASED_OUTPATIENT_CLINIC_OR_DEPARTMENT_OTHER): Payer: Self-pay

## 2019-06-16 DIAGNOSIS — E039 Hypothyroidism, unspecified: Secondary | ICD-10-CM | POA: Diagnosis not present

## 2019-06-16 DIAGNOSIS — R8281 Pyuria: Secondary | ICD-10-CM | POA: Insufficient documentation

## 2019-06-16 DIAGNOSIS — R109 Unspecified abdominal pain: Secondary | ICD-10-CM | POA: Insufficient documentation

## 2019-06-16 DIAGNOSIS — F17210 Nicotine dependence, cigarettes, uncomplicated: Secondary | ICD-10-CM | POA: Diagnosis not present

## 2019-06-16 DIAGNOSIS — Z79899 Other long term (current) drug therapy: Secondary | ICD-10-CM | POA: Diagnosis not present

## 2019-06-16 DIAGNOSIS — R1031 Right lower quadrant pain: Secondary | ICD-10-CM | POA: Diagnosis not present

## 2019-06-16 LAB — URINALYSIS, ROUTINE W REFLEX MICROSCOPIC
Bilirubin Urine: NEGATIVE
Glucose, UA: NEGATIVE mg/dL
Ketones, ur: NEGATIVE mg/dL
Nitrite: NEGATIVE
Protein, ur: NEGATIVE mg/dL
Specific Gravity, Urine: 1.015 (ref 1.005–1.030)
pH: 7 (ref 5.0–8.0)

## 2019-06-16 LAB — COMPREHENSIVE METABOLIC PANEL
ALT: 22 U/L (ref 0–44)
AST: 19 U/L (ref 15–41)
Albumin: 4.4 g/dL (ref 3.5–5.0)
Alkaline Phosphatase: 86 U/L (ref 38–126)
Anion gap: 8 (ref 5–15)
BUN: 11 mg/dL (ref 6–20)
CO2: 22 mmol/L (ref 22–32)
Calcium: 8.9 mg/dL (ref 8.9–10.3)
Chloride: 107 mmol/L (ref 98–111)
Creatinine, Ser: 0.72 mg/dL (ref 0.44–1.00)
GFR calc Af Amer: >60 mL/min (ref >60)
GFR calc non Af Amer: >60 mL/min (ref >60)
Glucose, Bld: 100 mg/dL — ABNORMAL HIGH (ref 70–99)
Potassium: 3.5 mmol/L (ref 3.5–5.1)
Sodium: 137 mmol/L (ref 135–145)
Total Bilirubin: 0.4 mg/dL (ref 0.3–1.2)
Total Protein: 7.7 g/dL (ref 6.5–8.1)

## 2019-06-16 LAB — URINALYSIS, MICROSCOPIC (REFLEX)

## 2019-06-16 LAB — CBC WITH DIFFERENTIAL/PLATELET
Abs Immature Granulocytes: 0.01 10*3/uL (ref 0.00–0.07)
Basophils Absolute: 0.1 10*3/uL (ref 0.0–0.1)
Basophils Relative: 1 %
Eosinophils Absolute: 0.2 10*3/uL (ref 0.0–0.5)
Eosinophils Relative: 3 %
HCT: 40.8 % (ref 36.0–46.0)
Hemoglobin: 13 g/dL (ref 12.0–15.0)
Immature Granulocytes: 0 %
Lymphocytes Relative: 33 %
Lymphs Abs: 1.9 10*3/uL (ref 0.7–4.0)
MCH: 28 pg (ref 26.0–34.0)
MCHC: 31.9 g/dL (ref 30.0–36.0)
MCV: 87.7 fL (ref 80.0–100.0)
Monocytes Absolute: 0.4 10*3/uL (ref 0.1–1.0)
Monocytes Relative: 6 %
Neutro Abs: 3.3 10*3/uL (ref 1.7–7.7)
Neutrophils Relative %: 57 %
Platelets: 412 10*3/uL — ABNORMAL HIGH (ref 150–400)
RBC: 4.65 MIL/uL (ref 3.87–5.11)
RDW: 14.1 % (ref 11.5–15.5)
WBC: 5.9 10*3/uL (ref 4.0–10.5)
nRBC: 0 % (ref 0.0–0.2)

## 2019-06-16 LAB — PREGNANCY, URINE: Preg Test, Ur: NEGATIVE

## 2019-06-16 LAB — LIPASE, BLOOD: Lipase: 22 U/L (ref 11–51)

## 2019-06-16 MED ORDER — ONDANSETRON 4 MG PO TBDP: 4.0000 mg | ORAL_TABLET | Freq: Three times a day (TID) | ORAL | 0 refills | Status: AC | PRN

## 2019-06-16 MED ORDER — CEPHALEXIN 500 MG PO CAPS: 500.0000 mg | ORAL_CAPSULE | Freq: Two times a day (BID) | ORAL | 0 refills | Status: AC

## 2019-06-16 MED ORDER — IOHEXOL 300 MG/ML  SOLN
100.0000 mL | Freq: Once | INTRAMUSCULAR | Status: AC | PRN
  Administered 2019-06-16: 100 mL via INTRAVENOUS

## 2019-06-16 MED ORDER — DICYCLOMINE HCL 20 MG PO TABS: 20.0000 mg | ORAL_TABLET | Freq: Two times a day (BID) | ORAL | 0 refills | Status: AC | PRN

## 2019-06-16 NOTE — ED Provider Notes (Addendum)
[facility] Provider Note   CSN: [id number] Arrival date & time: 06/16/19  1132     History   Chief Complaint Chief Complaint  Patient presents with   Abdominal Pain    HPI Katie Bishop is a 38 y.o. female w PMHx GERD, IBS, cholecystectomy, right oophorectomy, presenting to the ED with complaint of right sided abd pain that began Friday. Pt states sx woke her up from sleep on Friday and have been waxing and waning in severity since then. Pain is worse in right abdomen, initially began periumbilical. Pain is worse with movement and palpation. She reports assoc nausea and dec appetite. Denies changes in bowel habits, urinary sx or pelvic complaints. Currently menstruating.     The history is provided by the patient.    Past Medical History:  Diagnosis Date   Allergy    Anxiety    Eczema    GERD (gastroesophageal reflux disease)    Hypothyroidism    IBS (irritable bowel syndrome)    Other abnormalities of heart beat    heart arrhythmai PACs PVCs   PVC's (premature ventricular contractions)    Seasonal allergies    SVD (spontaneous vaginal delivery)    x 2 at [facility]   Thyroid disease     Patient Active Problem List   Diagnosis Date Noted   S/P right oophorectomy 06/12/2019   GAD (generalized anxiety disorder) 12/10/2018   Depression, major, single episode, moderate ([place]) 12/10/2018   PAC (premature atrial contraction) 10/12/2018   Vertigo 10/12/2018   Mild intermittent asthma 07/04/2018   Gastroesophageal reflux disease 02/14/2018   Perennial and seasonal allergic rhinitis 02/14/2018   Tobacco use 02/14/2018   Hypothyroidism 02/14/2018   Vitamin D deficiency 02/14/2018   Menorrhagia with irregular cycle 02/14/2018    Past Surgical History:  Procedure Laterality Date   CHOLECYSTECTOMY  2013   [place]   COLONOSCOPY     DIAGNOSTIC LAPAROSCOPY     x 2 - diagnostic/cysts   [place] Right [place]   [facility]   OTHER SURGICAL HISTORY  2016   sinus surgery in [place] Bilateral 2017   UPPER GI ENDOSCOPY     WISDOM TOOTH EXTRACTION       OB History   No obstetric history on file.      Home Medications    Prior to Admission medications   Medication Sig Start Date End Date Taking? Authorizing Provider  Acetaminophen (TYLENOL PO) Take by mouth.    [provider]  albuterol (PROVENTIL HFA;VENTOLIN HFA) 108 (90 Base) MCG/ACT inhaler Inhale 1-2 puffs into the lungs every 6 (six) hours as needed for wheezing or shortness of breath. 07/06/18   Bobbitt, Sed[place] Muta, MD  azelastine (ASTELIN) 0.1 % nasal spray Place 2 sprays into both nostrils 2 (two) times daily. Use in each nostril as directed. Need OV for further refills 04/15/19   Nche, Charlene Brooke, NP  Calcium Carb-Cholecalciferol (CALCIUM 600+D3) 600-800 MG-UNIT TABS Take 1 tablet by mouth daily.    [provider]  cephALEXin (KEFLEX) 500 MG capsule Take 1 capsule (500 mg total) by mouth 2 (two) times daily for 5 days. 06/16/19 06/21/19  Rita Prom, [place] N, PA-C  dicyclomine (BENTYL) 20 MG tablet Take 1 tablet (20 mg total) by mouth 2 (two) times daily as needed (abdominal cramping). 06/16/19   Elisse Pennick, [place] N, PA-C  famotidine (PEPCID) 20 MG tablet Take 1 tablet (20 mg total) by mouth at bedtime. 07/10/18   Nche,  Charlene Brooke, NP  Fexofenadine HCl (ALLEGRA PO) Take by mouth.    [provider]  fluticasone (FLONASE) 50 MCG/ACT nasal spray Place 2 sprays into both nostrils at bedtime. 05/08/18   Nche, Charlene Brooke, NP  levothyroxine (SYNTHROID) 100 MCG tablet Take 1 tablet (100 mcg total) by mouth at bedtime. 06/14/19   Nche, Charlene Brooke, NP  montelukast (SINGULAIR) 10 MG tablet Take 1 tablet (10 mg total) by mouth at bedtime. Needs OV for further refills 04/15/19   Nche, Charlene Brooke, NP  omega-3 acid ethyl esters (LOVAZA) 1 g capsule Take 1 capsule (1 g total) by mouth 2 (two)  times daily. Patient not taking: Reported on 06/12/2019 03/27/19   Nche, Charlene Brooke, NP  ondansetron (ZOFRAN ODT) 4 MG disintegrating tablet Take 1 tablet (4 mg total) by mouth every 8 (eight) hours as needed for nausea or vomiting. 06/16/19   Taneisha Fuson, [place] N, PA-C    Family History Family History  Problem Relation Age of Onset   Hyperlipidemia Mother    Hypertension Mother    Mitral valve prolapse Mother    Hypothyroidism Mother    Allergic rhinitis Mother    Hyperlipidemia Father    Allergic rhinitis Father    Multiple myeloma Maternal Grandmother    Anuerysm Paternal Grandmother        brain   Cancer Paternal Grandfather        lung cancer   Cancer Maternal Aunt        breast cancer   Cancer Maternal Aunt        breast cancer   Cancer Maternal Aunt        breast cancer   Asthma Sister    Allergic rhinitis Sister    Asthma Daughter    Allergic rhinitis Daughter    Allergic rhinitis Daughter    Eczema Neg Hx    Urticaria Neg Hx    Immunodeficiency Neg Hx    Atopy Neg Hx    Angioedema Neg Hx     Social History Social History   Tobacco Use   Smoking status: Current Every Day Smoker    Packs/day: 0.50    Years: 18.00    Pack years: 9.00    Types: Cigarettes   Smokeless tobacco: Never Used  Substance Use Topics   Alcohol use: Yes    Comment: social    Drug use: Never     Allergies   Ciprofloxacin, Fluocinolone, Sulfamethoxazole-trimethoprim, Tetanus toxoids, and Excedrin tension headache [acetaminophen-caffeine]   Review of Systems Review of Systems  All other systems reviewed and are negative.    Physical Exam Updated Vital Signs BP 127/88 (BP Location: Right Arm)    Pulse 88    Temp 98.6 F (37 C) (Oral)    Resp 16    Ht _0  (1.676 m)    Wt 95.3 kg    SpO2 100%    BMI 33.89 kg/m   Physical Exam Vitals signs and nursing note reviewed.  Constitutional:      General: She is not in acute distress.    Appearance:  She is well-developed.  HENT:     Head: Normocephalic and atraumatic.  Eyes:     Conjunctiva/sclera: Conjunctivae normal.  Cardiovascular:     Rate and Rhythm: Normal rate and regular rhythm.  Pulmonary:     Effort: Pulmonary effort is normal.     Breath sounds: Normal breath sounds.  Abdominal:     General: Bowel sounds are normal.  Palpations: Abdomen is soft.     Tenderness: There is abdominal tenderness in the right lower quadrant. There is no guarding or rebound.  Skin:    General: Skin is warm.  Neurological:     Mental Status: She is alert.  Psychiatric:        Behavior: Behavior normal.      ED Treatments / Results  Labs (all labs ordered are listed, but only abnormal results are displayed) Labs Reviewed  COMPREHENSIVE METABOLIC PANEL - Abnormal; Notable for the following components:      Result Value   Glucose, Bld 100 (*)    All other components within normal limits  CBC WITH DIFFERENTIAL/PLATELET - Abnormal; Notable for the following components:   Platelets 412 (*)    All other components within normal limits  URINALYSIS, ROUTINE W REFLEX MICROSCOPIC - Abnormal; Notable for the following components:   Hgb urine dipstick MODERATE (*)    Leukocytes,Ua SMALL (*)    All other components within normal limits  URINALYSIS, MICROSCOPIC (REFLEX) - Abnormal; Notable for the following components:   Bacteria, UA MANY (*)    All other components within normal limits  URINE CULTURE  LIPASE, BLOOD  PREGNANCY, URINE    EKG None  Radiology Ct Abdomen Pelvis W Contrast  Result Date: 06/16/2019 CLINICAL DATA:  Abdominal pain. Appendicitis suspected. Pain is described as right lower quadrant with nausea. EXAM: CT ABDOMEN AND PELVIS WITH CONTRAST TECHNIQUE: Multidetector CT imaging of the abdomen and pelvis was performed using the standard protocol following bolus administration of intravenous contrast. CONTRAST:  167m OMNIPAQUE IOHEXOL 300 MG/ML  SOLN COMPARISON:  CT,  04/20/2018. FINDINGS: Lower chest: Clear lung bases.  Heart normal in size. Hepatobiliary: No focal liver abnormality is seen. Status post cholecystectomy. No biliary dilatation. Pancreas: Unremarkable. No pancreatic ductal dilatation or surrounding inflammatory changes. Spleen: Normal in size without focal abnormality. Adrenals/Urinary Tract: Adrenal glands are unremarkable. Kidneys are normal, without renal calculi, focal lesion, or hydronephrosis. Bladder is unremarkable. Stomach/Bowel: Normal appendix visualized. Normal stomach. Small bowel and colon are normal in caliber. No wall thickening. No inflammation. Vascular/Lymphatic: No significant vascular findings are present. No enlarged abdominal or pelvic lymph nodes. Reproductive: Uterus and bilateral adnexa are unremarkable. Other: No abdominal wall hernia or abnormality. No abdominopelvic ascites. Musculoskeletal: No acute or significant osseous findings. Dextroscoliosis, apex at the thoracolumbar spine. IMPRESSION: 1. No acute findings within the abdomen or pelvis. Normal appendix visualized. No findings to account for the patient's symptoms. 2. Status post cholecystectomy. Scoliosis. Exam otherwise unremarkable. Electronically Signed   By: DLajean ManesM.D.   On: 06/16/2019 13:41    Procedures Procedures (including critical care time)  Medications Ordered in ED Medications  iohexol (OMNIPAQUE) 300 MG/ML solution 100 mL (100 mLs Intravenous Contrast Given 06/16/19 1259)     Initial Impression / Assessment and Plan / ED Course  I have reviewed the triage vital signs and the nursing notes.  Pertinent labs & imaging results that were available during my care of the patient were reviewed by me and considered in my medical decision making (see chart for details).        Patient w hx IBS presenting with right sided abdominal pain that began Friday. Associated symptoms include nausea and dec appetite. Patient is nontoxic, nonseptic appearing,  in no apparent distress. Abdomen is soft  w RLQ TTP no guarding or rebound. Pt has hx of cholecystectomy and right oophorectomy. Concern for possible acute appendicitis, labs and CT imaging ordered. Pt  not requiring and medications for sx at this time.    Labs, imaging and vitals reviewed.  No leukocytosis or electrolyte disturbance.  Lipase within normal limits.  Negative pregnancy.  Urine with bacteria and small leukocytes, hemoglobin present though likely due to patient's current menstruation.  Will send culture.  CT scan is negative for any acute or abnormal findings.  Patient is not complaining of any pelvic complaints.  On reevaluation she remains well-appearing without complaint.  She now states this is not the first occurrence of these similar symptoms.  Suspect this may be related to patient's IBS?  Patient agreeable with discharge home with outpatient follow-up with her PCP.  Will prescribe Zofran.  Patient would like to trial Bentyl.  Return precautions discussed.  Agreeable to plan and safe for discharge.   Discussed results, findings, treatment and follow up. Patient advised of return precautions. Patient verbalized understanding and agreed with plan.  Final Clinical Impressions(s) / ED Diagnoses   Final diagnoses:  Right sided abdominal pain  Pyuria    ED Discharge Orders         Ordered    ondansetron (ZOFRAN ODT) 4 MG disintegrating tablet  Every 8 hours PRN     06/16/19 1504    dicyclomine (BENTYL) 20 MG tablet  2 times daily PRN     06/16/19 1504    cephALEXin (KEFLEX) 500 MG capsule  2 times daily     06/16/19 1505           Kaelan Emami, [place] N, PA-C 06/16/19 1512    Charlesetta Shanks, MD 06/26/19 [place], [place] N, [place] 07/23/19 1729    Charlesetta Shanks, MD 07/29/19 [phone]

## 2019-06-16 NOTE — ED Notes (Signed)
Pt ambulated to bathroom with steady gait to provide urine specimen 

## 2019-06-16 NOTE — ED Notes (Signed)
Patient transported to CT 

## 2019-06-16 NOTE — Discharge Instructions (Addendum)
Please read instructions below. Drink clear liquids until your stomach feels better. Then, slowly introduce bland foods into your diet as tolerated, such as bread, rice, apples, bananas. You can take zofran every 8 hours as needed for nausea. Take the antibiotic, keflex/cephalexin, as prescribed until gone.  Follow up with your primary care. Return to the ER for severely worsening abdominal pain, fever, uncontrollable vomiting, or new or concerning symptoms.

## 2019-06-16 NOTE — ED Triage Notes (Signed)
Pt reports intermittent right sided abdominal pain beginning Friday, severe at times.  Taking ibuprofen without relief. Mild nausea assosciated with attempting po foods/fluids.  Pt able to keep down po fluids.  Denies fever, denies urinary symptoms.

## 2019-06-17 LAB — URINE CULTURE: Culture: <10000 — AB

## 2019-06-27 DIAGNOSIS — F438 Other reactions to severe stress: Secondary | ICD-10-CM | POA: Diagnosis not present

## 2019-07-03 ENCOUNTER — Ambulatory Visit (INDEPENDENT_AMBULATORY_CARE_PROVIDER_SITE_OTHER): Payer: 59 | Admitting: Psychiatry

## 2019-07-03 ENCOUNTER — Encounter (HOSPITAL_COMMUNITY): Payer: Self-pay | Admitting: Psychiatry

## 2019-07-03 ENCOUNTER — Other Ambulatory Visit: Payer: Self-pay

## 2019-07-03 DIAGNOSIS — F411 Generalized anxiety disorder: Secondary | ICD-10-CM | POA: Diagnosis not present

## 2019-07-03 DIAGNOSIS — F909 Attention-deficit hyperactivity disorder, unspecified type: Secondary | ICD-10-CM

## 2019-07-03 MED ORDER — ATOMOXETINE HCL 40 MG PO CAPS: ORAL_CAPSULE | ORAL | 0 refills | Status: AC

## 2019-07-03 MED FILL — ATOMOXETINE HCL 40 MG CAPS: 40 | 40 days supply | Qty: 70 | Fill #0

## 2019-07-03 NOTE — Progress Notes (Signed)
Psychiatric Initial Adult Assessment   Patient Identification: Katie Bishop MRN:  [id number] Date of Evaluation:  07/03/2019 Referral Source: Wilfred Lacy NP Chief Complaint:  Problems with concentration/task completion. Visit Diagnosis:    ICD-10-CM   1. Adult ADHD  F90.9   2. GAD (generalized anxiety disorder)  F41.1   Interview was conducted using WebEx teleconferencing application and I verified that I was speaking with the correct person using two identifiers. I discussed the limitations of evaluation and management by telemedicine and  the availability of in person appointments. Patient expressed understanding and agreed to proceed.  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 Her focusing problems also affect her at home but she has learned to deal with these issues (takes notes, makes lists of things to do). Her younger sister has been formally diagnosed with ADHD and is taking Adderall. Katie Bishop has a hx of PVCs and does not want to try stimulants out of concern for their potential to exacerbate arrhythmia.   She has been dealing with excessive worrying, rumination for a long time and has tried few SSRIs (escitalopram, fluoxetine, sertraline) which either caused alexithymia (former) or headaches (latter) as well as bupropion which triggered "moodiness". She lalso was prescribed lorazepam briefly when she became more anxious and depressed earlier this year as a result of marital problems (husband's infidelity) and at one point considered separation. She has been attending individual and couples therapy and her mood has markedly improved. She has no prior history of depression,  suciidal thoughts, mania or psychosis. She does not abuse alcohol or drugs. She has no hx of inpatient psychiatric admissions.  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 She is married and they have two daughters age 38 and 84.  Medical hx includes IBS, GERD, seasonal allergies, hypothyroidism and above mentioned PVCs.  Associated Signs/Symptoms: Depression Symptoms:  anxiety, difficulty concentrating (Hypo) Manic Symptoms:  None Anxiety Symptoms:  Excessive Worry, Psychotic Symptoms:  None PTSD Symptoms: Negative  Past Psychiatric History: See above  Previous Psychotropic Medications: Yes   Substance Abuse History in the last 12 months:  No.  Consequences of Substance Abuse: NA  Past Medical History:  Past Medical History:  Diagnosis Date  . Allergy   . Anxiety   . Eczema   . GERD (gastroesophageal reflux disease)   . Hypothyroidism   . IBS (irritable bowel syndrome)   . Other abnormalities of heart beat    heart arrhythmai PACs PVCs  . PVC's (premature ventricular contractions)   . Seasonal allergies   . SVD (spontaneous vaginal delivery)    x 2 at [place]  . Thyroid disease     Past Surgical History:  Procedure Laterality Date  . CHOLECYSTECTOMY  2013   [place]  . COLONOSCOPY    . DIAGNOSTIC LAPAROSCOPY     x 2 - diagnostic/cysts  . OOP[place]ECTOMY Right 2017   [facility] [place]  . OTHER SURGICAL HISTORY  2016   sinus surgery in [place]  . TUBAL LIGATION Bilateral 2017  . UPPER GI ENDOSCOPY    . WISDOM TOOTH EXTRACTION      Family Psychiatric History: Reviewed.  Family History:  Family History  Problem Relation  Age of Onset  . Hyperlipidemia Mother   . Hypertension Mother   . Mitral valve prolapse Mother   . Hypothyroidism Mother   . Allergic rhinitis Mother   . Hyperlipidemia Father   . Allergic rhinitis Father   . Multiple myeloma Maternal Grandmother   . Anuerysm Paternal Grandmother        brain  . Cancer Paternal Grandfather         lung cancer  . Cancer Maternal Aunt        breast cancer  . Cancer Maternal Aunt        breast cancer  . Cancer Maternal Aunt        breast cancer  . Asthma Sister   . Allergic rhinitis Sister   . ADD / ADHD Sister   . Asthma Daughter   . Allergic rhinitis Daughter   . Allergic rhinitis Daughter   . Eczema Neg Hx   . Urticaria Neg Hx   . Immunodeficiency Neg Hx   . Atopy Neg Hx   . Angioedema Neg Hx     Social History:   Social History   Socioeconomic History  . Marital status: Married    Spouse name: Not on file  . Number of children: 2  . Years of education: Not on file  . Highest education level: Not on file  Occupational History  . Not on file  Social Needs  . Financial resource strain: Not on file  . Food insecurity    Worry: Not on file    Inability: Not on file  . Transportation needs    Medical: Not on file    Non-medical: Not on file  Tobacco Use  . Smoking status: Current Every Day Smoker    Packs/day: 0.50    Years: 18.00    Pack years: 9.00    Types: Cigarettes  . Smokeless tobacco: Never Used  Substance and Sexual Activity  . Alcohol use: Yes    Comment: social   . Drug use: Never  . Sexual activity: Yes    Birth control/protection: Surgical  Lifestyle  . Physical activity    Days per week: Not on file    Minutes per session: Not on file  . Stress: Not on file  Relationships  . Social Herbalist on phone: Not on file    Gets together: Not on file    Attends religious service: Not on file    Active member of club or organization: Not on file    Attends meetings of clubs or organizations: Not on file    Relationship status: Not on file  Other Topics Concern  . Not on file  Social History Narrative  . Not on file     Allergies:   Allergies  Allergen Reactions  . Ciprofloxacin Anaphylaxis  . Fluocinolone Swelling  . Sulfamethoxazole-Trimethoprim Anaphylaxis  . Tetanus Toxoids Anaphylaxis  . Excedrin Tension Headache  [Acetaminophen-Caffeine] Palpitations    Metabolic Disorder Labs: No results found for: HGBA1C, MPG No results found for: PROLACTIN Lab Results  Component Value Date   CHOL 190 06/12/2019   TRIG 98.0 06/12/2019   HDL 32.20 (L) 06/12/2019   CHOLHDL 6 06/12/2019   VLDL 19.6 06/12/2019   LDLCALC 138 (H) 06/12/2019   LDLCALC 103 (H) 02/14/2018   Lab Results  Component Value Date   TSH 2.76 06/12/2019    Therapeutic Level Labs: No results found for: LITHIUM No results found for: CBMZ No results found for: VALPROATE  Current Medications: Current Outpatient Medications  Medication Sig Dispense Refill  . Acetaminophen (TYLENOL PO) Take by mouth.    Marland Kitchen albuterol (PROVENTIL HFA;VENTOLIN HFA) 108 (90 Base) MCG/ACT inhaler Inhale 1-2 puffs into the lungs every 6 (six) hours as needed for wheezing or shortness of breath. 18 g 1  . atomoxetine (STRATTERA) 40 MG capsule Take 1 capsule (40 mg total) by mouth daily for 10 days, THEN 2 capsules (80 mg total) daily. 70 capsule 0  . azelastine (ASTELIN) 0.1 % nasal spray Place 2 sprays into both nostrils 2 (two) times daily. Use in each nostril as directed. Need OV for further refills 30 mL 2  . Calcium Carb-Cholecalciferol (CALCIUM 600+D3) 600-800 MG-UNIT TABS Take 1 tablet by mouth daily.    Marland Kitchen dicyclomine (BENTYL) 20 MG tablet Take 1 tablet (20 mg total) by mouth 2 (two) times daily as needed (abdominal cramping). 20 tablet 0  . famotidine (PEPCID) 20 MG tablet Take 1 tablet (20 mg total) by mouth at bedtime. 90 tablet 3  . Fexofenadine HCl (ALLEGRA PO) Take by mouth.    . fluticasone (FLONASE) 50 MCG/ACT nasal spray Place 2 sprays into both nostrils at bedtime. 16 g 1  . levothyroxine (SYNTHROID) 100 MCG tablet Take 1 tablet (100 mcg total) by mouth at bedtime. 90 tablet 1  . montelukast (SINGULAIR) 10 MG tablet Take 1 tablet (10 mg total) by mouth at bedtime. Needs OV for further refills 90 tablet 0  . omega-3 acid ethyl esters (LOVAZA) 1 g  capsule Take 1 capsule (1 g total) by mouth 2 (two) times daily. (Patient not taking: Reported on 06/12/2019) 60 capsule 5  . ondansetron (ZOFRAN ODT) 4 MG disintegrating tablet Take 1 tablet (4 mg total) by mouth every 8 (eight) hours as needed for nausea or vomiting. 20 tablet 0   No current facility-administered medications for this visit.     Psychiatric Specialty Exam: Review of Systems  Psychiatric/Behavioral: The patient is nervous/anxious.   All other systems reviewed and are negative.   There were no vitals taken for this visit.There is no height or weight on file to calculate BMI.  General Appearance: Casual and Well Groomed  Eye Contact:  Good  Speech:  Clear and Coherent and Normal Rate  Volume:  Normal  Mood:  Mildly anxious  Affect:  Full Range  Thought Process:  Goal Directed and Linear  Orientation:  Full (Time, Place, and Person)  Thought Content:  Logical  Suicidal Thoughts:  No  Homicidal Thoughts:  No  Memory:  Immediate;   Good Recent;   Good Remote;   Good  Judgement:  Good  Insight:  Good  Psychomotor Activity:  Normal  Concentration:  Concentration: Fair  Recall:  Good  Fund of Knowledge:Good  Language: Good  Akathisia:  Negative  Handed:  Right  AIMS (if indicated):  not done  Assets:  Communication Skills Desire for Improvement Financial Resources/Insurance Housing Resilience Social Support Vocational/Educational  ADL's:  Intact  Cognition: WNL  Sleep:  Good   Screenings: GAD-7     Office Visit from 12/10/2018 in [facility]  Total GAD-7 Score  16    PHQ2-9     Office Visit from 06/12/2019 in [facility] Visit from 12/10/2018 in [facility] Visit from 02/14/2018 in [facility]  PHQ-2 Total Score  0  1  0  PHQ-9 Total Score  -  10  -  Assessment and Plan: 38 yo married female with a hx of generalized anxiety, currently stable on no  meds, and long standing difficulties with focusing, tasl completion, procrastination which started 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 Her focusing problems also affect her at home but she has learned to deal with these issues (takes notes, makes lists of things to do). Her younger sister has been formally diagnosed with ADHD and is taking Adderall. Katie Bishop has a hx of PVCs and does not want to try stimulants out of concern for their potential to exacerbate arrhythmia.   She has been dealing with excessive worrying, rumination for a long time and has tried few SSRIs (escitalopram, fluoxetine, sertraline) which either caused alexithymia (former) or headaches (latter) as well as bupropion which triggered "moodiness". She lalso was prescribed lorazepam briefly when she became more anxious and depressed earlier this year as a result of marital problems (husband's infidelity) and at one point considered separation. She has been attending individual and couples therapy and her mood has markedly improved. She has no prior history of depression, suciidal thoughts, mania or psychosis. She does not abuse alcohol or drugs.   Dx: Adult ADHD; GAD  Plan: We have discussed various pharmacological options for treatment of ADHD and ultimately chose a trial of Strattera: she will start on 40 mg daily for 10 days then increase dose to 80 mg (she can split dose into 40 mg bid if 80 mg is not tolerated well e.g. has headaches or nausea). Next appointment in 5 weeks. The plan was discussed with patient who had an opportunity to ask questions and these were all answered. I spend 60 minutes in videoconferencing with the patient and devoted approximately 50% of this time to explanation of diagnosis, discussion of treatment options and med education.  Stephanie Acre, MD 11/18/20209:05 AM

## 2019-07-10 ENCOUNTER — Other Ambulatory Visit: Payer: Self-pay | Admitting: Nurse Practitioner

## 2019-07-10 DIAGNOSIS — J4520 Mild intermittent asthma, uncomplicated: Secondary | ICD-10-CM

## 2019-07-10 DIAGNOSIS — J3089 Other allergic rhinitis: Secondary | ICD-10-CM

## 2019-07-15 MED FILL — MONTELUKAST SOD 10 MG TAB: 10 | 90 days supply | Qty: 90 | Fill #0

## 2019-07-18 DIAGNOSIS — F438 Other reactions to severe stress: Secondary | ICD-10-CM | POA: Diagnosis not present

## 2019-08-07 ENCOUNTER — Ambulatory Visit (HOSPITAL_COMMUNITY): Payer: 59 | Admitting: Psychiatry

## 2019-08-07 ENCOUNTER — Other Ambulatory Visit: Payer: Self-pay

## 2019-08-19 DIAGNOSIS — Z20828 Contact with and (suspected) exposure to other viral communicable diseases: Secondary | ICD-10-CM | POA: Diagnosis not present

## 2019-08-19 DIAGNOSIS — R05 Cough: Secondary | ICD-10-CM | POA: Diagnosis not present

## 2019-08-19 DIAGNOSIS — R519 Headache, unspecified: Secondary | ICD-10-CM | POA: Diagnosis not present

## 2019-09-25 MED FILL — LEVOTHYROXINE SODIUM 100 MC: 100 | 90 days supply | Qty: 90 | Fill #1

## 2019-10-11 MED FILL — MONTELUKAST SOD 10 MG TAB: 10 | 90 days supply | Qty: 90 | Fill #1

## 2019-10-28 DIAGNOSIS — Z20828 Contact with and (suspected) exposure to other viral communicable diseases: Secondary | ICD-10-CM | POA: Diagnosis not present

## 2019-10-28 DIAGNOSIS — J069 Acute upper respiratory infection, unspecified: Secondary | ICD-10-CM | POA: Diagnosis not present

## 2019-12-05 ENCOUNTER — Encounter: Payer: Self-pay | Admitting: Nurse Practitioner

## 2019-12-05 DIAGNOSIS — R42 Dizziness and giddiness: Secondary | ICD-10-CM

## 2019-12-09 MED ORDER — SCOPOLAMINE 1 MG/3DAYS TD PT72: MEDICATED_PATCH | TRANSDERMAL | 0 refills | Status: AC

## 2019-12-09 MED FILL — SCOPOLAMINE 1 MG/3DAYS PT72: 1 | 30 days supply | Qty: 10 | Fill #0

## 2019-12-10 DIAGNOSIS — F438 Other reactions to severe stress: Secondary | ICD-10-CM | POA: Diagnosis not present

## 2019-12-10 DIAGNOSIS — Z630 Problems in relationship with spouse or partner: Secondary | ICD-10-CM | POA: Diagnosis not present

## 2019-12-11 DIAGNOSIS — Z0279 Encounter for issue of other medical certificate: Secondary | ICD-10-CM

## 2020-01-08 ENCOUNTER — Other Ambulatory Visit: Payer: Self-pay | Admitting: Nurse Practitioner

## 2020-01-08 DIAGNOSIS — E039 Hypothyroidism, unspecified: Secondary | ICD-10-CM

## 2020-01-08 MED FILL — MONTELUKAST SOD 10 MG TAB: 10 | 90 days supply | Qty: 90 | Fill #2

## 2020-01-10 MED FILL — LEVOTHYROXINE SODIUM 100 MC: 100 | 90 days supply | Qty: 90 | Fill #0

## 2020-02-20 ENCOUNTER — Ambulatory Visit: Payer: 59 | Admitting: Nurse Practitioner

## 2020-03-12 DIAGNOSIS — Z20828 Contact with and (suspected) exposure to other viral communicable diseases: Secondary | ICD-10-CM | POA: Diagnosis not present

## 2020-03-12 DIAGNOSIS — R509 Fever, unspecified: Secondary | ICD-10-CM | POA: Diagnosis not present

## 2020-03-12 DIAGNOSIS — J0100 Acute maxillary sinusitis, unspecified: Secondary | ICD-10-CM | POA: Diagnosis not present

## 2020-03-13 ENCOUNTER — Encounter: Payer: Self-pay | Admitting: Nurse Practitioner

## 2020-03-13 ENCOUNTER — Telehealth: Payer: Self-pay | Admitting: Nurse Practitioner

## 2020-03-13 NOTE — Telephone Encounter (Signed)
Pt was no show for appt 02/20/20. First occurrence. Fee waived. Letter mailed.  PCP,  Please reply back with corresponding letter matching appropriate follow up needs.  A - No follow up necessary B - Follow up urgent - locate patient immediately to schedule appointment. C - Follow up necessary. Contact patient and schedule visit w/in 7 days. D - Follow up necessary. Contact patient and schedule visit w/in 2-4 weeks.  E - Follow up necessary. Contact patient and schedule visit w/in 3 months.

## 2020-03-13 NOTE — Telephone Encounter (Signed)
D

## 2020-03-16 ENCOUNTER — Telehealth: Payer: Self-pay | Admitting: Nurse Practitioner

## 2020-03-16 NOTE — Telephone Encounter (Signed)
Please contact pt to schedule.   

## 2020-03-16 NOTE — Telephone Encounter (Signed)
Called patient regarding canceled appointment on 07/08. Left message to give the office a call back to reschedule.

## 2020-03-30 DIAGNOSIS — Z20822 Contact with and (suspected) exposure to covid-19: Secondary | ICD-10-CM | POA: Diagnosis not present

## 2020-04-07 DIAGNOSIS — E039 Hypothyroidism, unspecified: Secondary | ICD-10-CM | POA: Diagnosis not present

## 2020-04-07 DIAGNOSIS — J301 Allergic rhinitis due to pollen: Secondary | ICD-10-CM | POA: Diagnosis not present

## 2020-04-07 DIAGNOSIS — F418 Other specified anxiety disorders: Secondary | ICD-10-CM | POA: Diagnosis not present

## 2020-04-07 DIAGNOSIS — H8103 Meniere's disease, bilateral: Secondary | ICD-10-CM | POA: Diagnosis not present

## 2020-04-07 MED FILL — PROMETHAZINE 25 MG TABLET: 25 | 10 days supply | Qty: 40 | Fill #0

## 2020-04-07 MED FILL — MONTELUKAST SOD 10 MG TAB: 10 | 30 days supply | Qty: 30 | Fill #0

## 2020-04-07 MED FILL — LORazepam 0.5 MG TABS: 0.5 | 30 days supply | Qty: 30 | Fill #0

## 2020-04-07 MED FILL — LEVOTHYROXINE SODIUM 100 MC: 100 | 30 days supply | Qty: 30 | Fill #0

## 2020-04-07 MED FILL — ONDANSETRON HCL 4 MG TABS: 4 | 10 days supply | Qty: 30 | Fill #0

## 2020-05-11 MED FILL — MONTELUKAST SOD 10 MG TAB: 10 | 30 days supply | Qty: 30 | Fill #1

## 2020-05-11 MED FILL — LEVOTHYROXINE SODIUM 100 MC: 100 | 30 days supply | Qty: 30 | Fill #1

## 2020-06-16 MED FILL — LEVOTHYROXINE SODIUM 100 MC: 100 | 30 days supply | Qty: 30 | Fill #2

## 2020-06-16 MED FILL — MONTELUKAST SOD 10 MG TAB: 10 | 30 days supply | Qty: 30 | Fill #2

## 2020-07-16 MED FILL — MONTELUKAST SOD 10 MG TAB: 10 | 30 days supply | Qty: 30 | Fill #3

## 2020-07-16 MED FILL — LEVOTHYROXINE SODIUM 100 MC: 100 | 30 days supply | Qty: 30 | Fill #3

## 2020-08-13 MED FILL — LEVOTHYROXINE SODIUM 100 MC: 100 | 30 days supply | Qty: 30 | Fill #4

## 2020-08-13 MED FILL — MONTELUKAST SOD 10 MG TAB: 10 | 30 days supply | Qty: 30 | Fill #4

## 2020-11-04 ENCOUNTER — Other Ambulatory Visit (HOSPITAL_BASED_OUTPATIENT_CLINIC_OR_DEPARTMENT_OTHER): Payer: Self-pay

## 2021-01-22 DIAGNOSIS — J069 Acute upper respiratory infection, unspecified: Secondary | ICD-10-CM | POA: Diagnosis not present

## 2021-06-28 ENCOUNTER — Other Ambulatory Visit: Payer: Self-pay | Admitting: Obstetrics and Gynecology

## 2021-06-28 DIAGNOSIS — Z1231 Encounter for screening mammogram for malignant neoplasm of breast: Secondary | ICD-10-CM

## 2021-06-30 ENCOUNTER — Ambulatory Visit
Admission: RE | Admit: 2021-06-30 | Discharge: 2021-06-30 | Disposition: A | Discharge status: Home / Self Care | Payer: Commercial Managed Care - PPO | Source: Ambulatory Visit

## 2021-06-30 ENCOUNTER — Other Ambulatory Visit: Payer: Self-pay

## 2021-06-30 DIAGNOSIS — Z1231 Encounter for screening mammogram for malignant neoplasm of breast: Secondary | ICD-10-CM

## 2021-10-30 ENCOUNTER — Other Ambulatory Visit (HOSPITAL_COMMUNITY): Payer: Self-pay

## 2021-10-30 MED ORDER — ESTRADIOL 0.1 MG/GM VA CREA
TOPICAL_CREAM | VAGINAL | 0 refills | Status: AC
  Filled 2021-10-30: qty 42.5, 30d supply, fill #0

## 2021-11-01 ENCOUNTER — Other Ambulatory Visit (HOSPITAL_COMMUNITY): Payer: Self-pay

## 2021-11-03 ENCOUNTER — Other Ambulatory Visit (HOSPITAL_COMMUNITY): Payer: Self-pay

## 2021-11-03 MED ORDER — LEVOTHYROXINE SODIUM 25 MCG PO TABS
ORAL_TABLET | ORAL | 0 refills | Status: AC
  Filled 2021-11-03: qty 90, 90d supply, fill #0

## 2022-02-07 IMAGING — MG MM DIGITAL SCREENING BILAT W/ TOMO AND CAD
8 series · 8 of 24 positions shown · non-contrast
Comparison: Previous exam(s).

CLINICAL DATA: Screening.

EXAM:
DIGITAL SCREENING BILATERAL MAMMOGRAM WITH TOMOSYNTHESIS AND CAD
TECHNIQUE: Bilateral screening digital craniocaudal and mediolateral oblique
mammograms were obtained. Bilateral screening digital breast
tomosynthesis was performed. The images were evaluated with
computer-aided detection.

[R CC synth-2D]
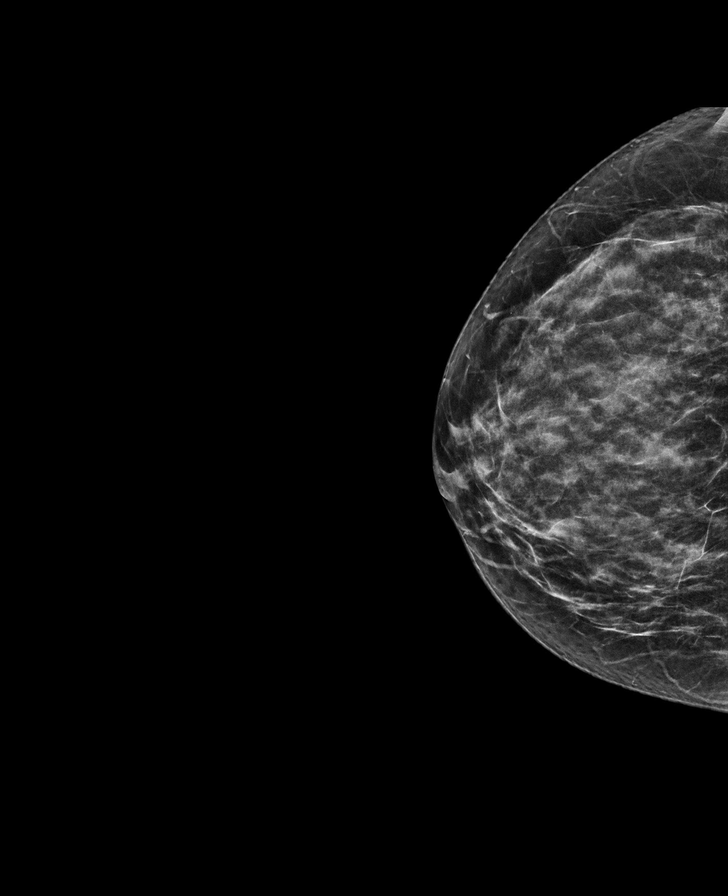

[L MLO synth-2D]
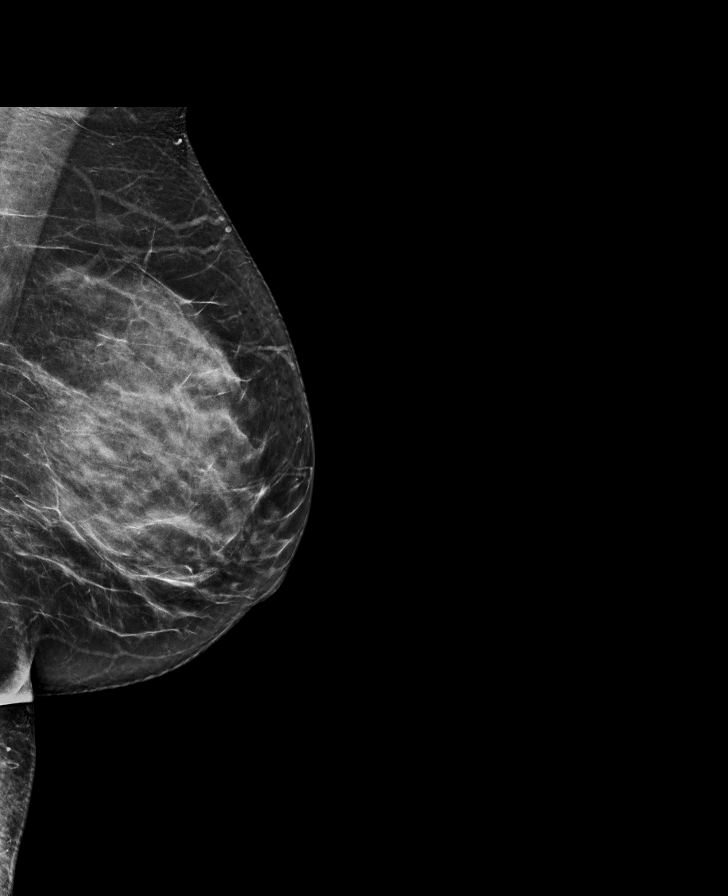

[L CC synth-2D]
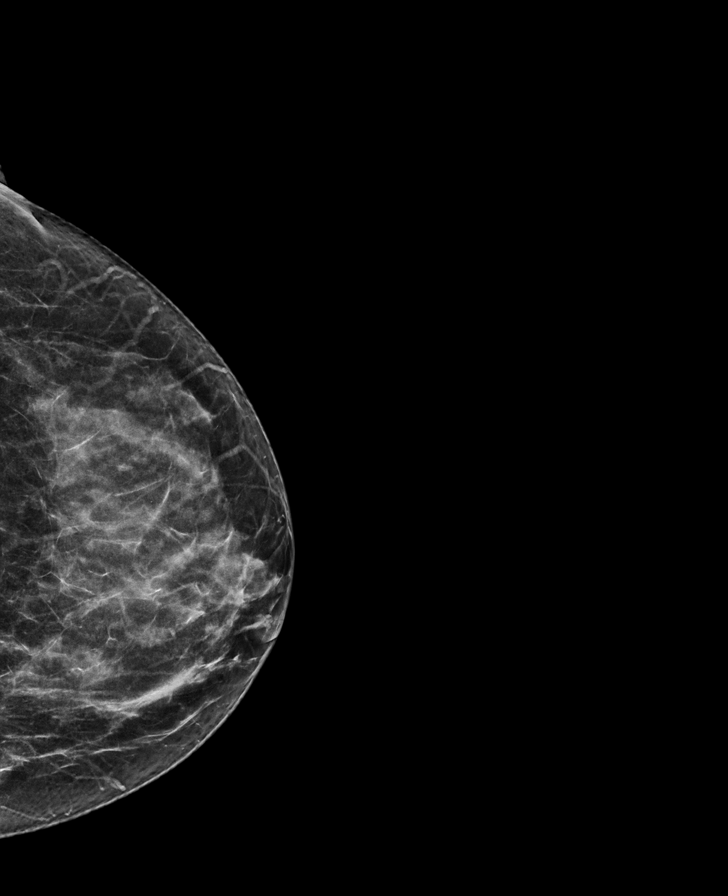

[R MLO synth-2D]
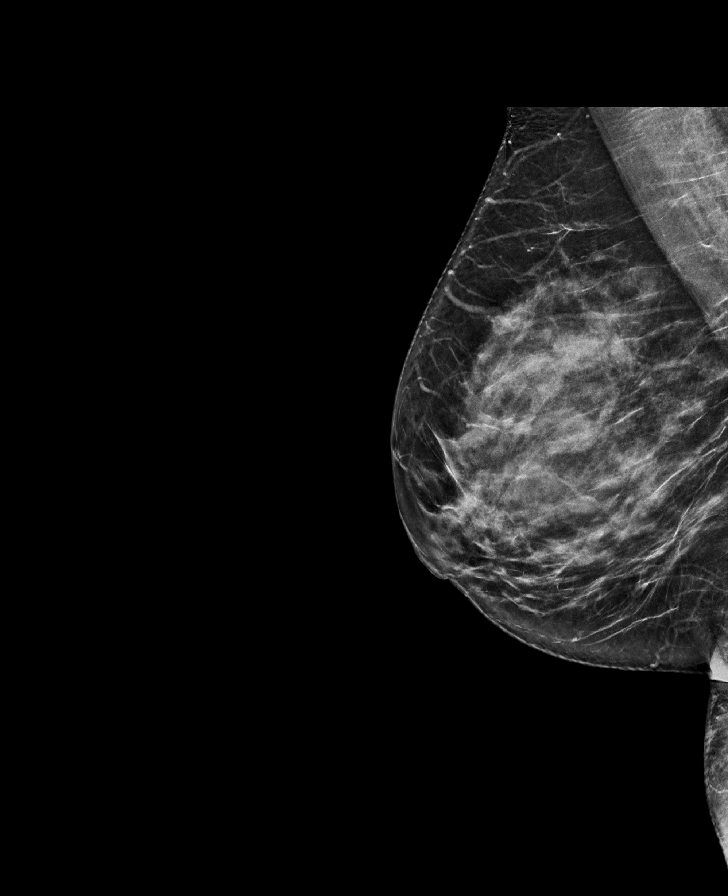

[L MLO tomo · tomo slice 35/70.0]
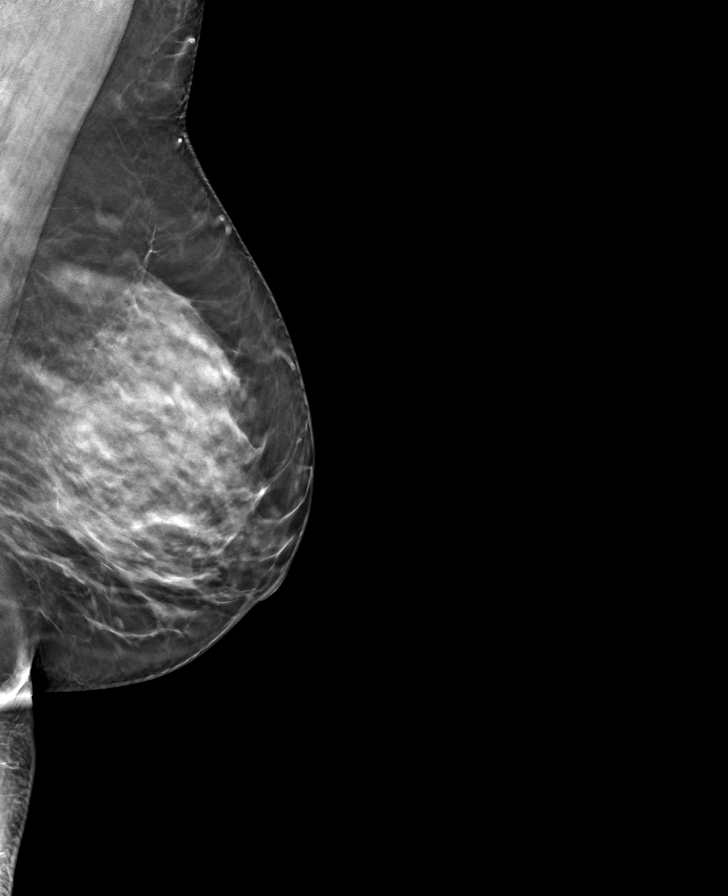

[L CC tomo · tomo slice 31/62.0]
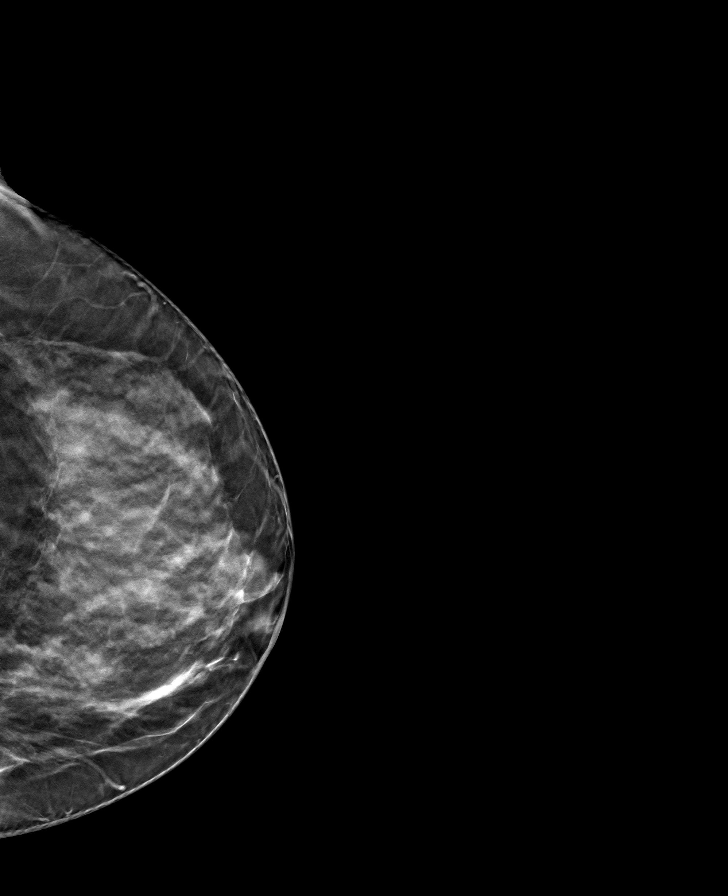

[R MLO tomo · tomo slice 35/70.0]
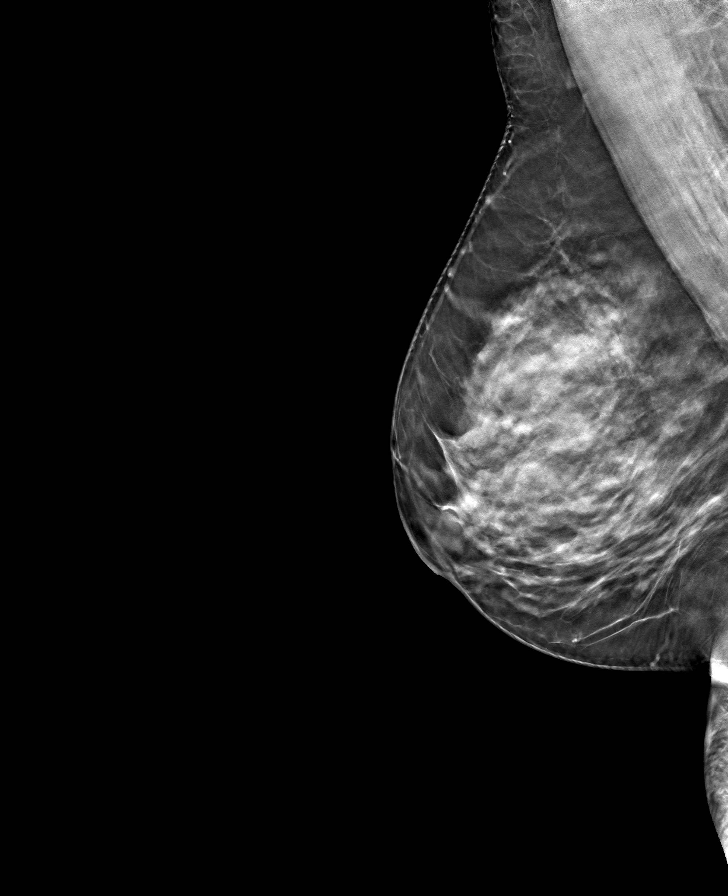

[R CC tomo · tomo slice 30/59.0]
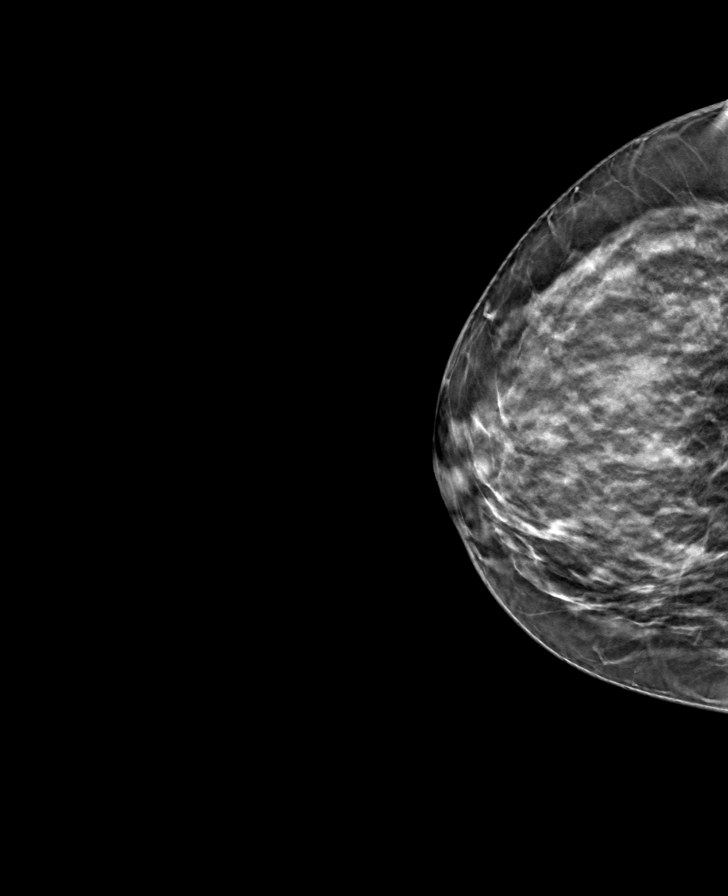

[8 of 24 positions shown; findings below may reference images not displayed]

ACR Breast Density Category c: The breast tissue is heterogeneously
dense, which may obscure small masses.
FINDINGS: There are no findings suspicious for malignancy.
IMPRESSION: No mammographic evidence of malignancy. A result letter of this
screening mammogram will be mailed directly to the patient.

RECOMMENDATION:
Screening mammogram in one year. (Code:Q3-W-BC3)

BI-RADS CATEGORY  1: Negative.

## 2022-08-23 ENCOUNTER — Encounter: Payer: Self-pay | Admitting: Obstetrics and Gynecology

## 2022-08-23 DIAGNOSIS — Z1231 Encounter for screening mammogram for malignant neoplasm of breast: Secondary | ICD-10-CM
